# Patient Record
Sex: Male | Born: 1935 | Race: Black or African American | Hispanic: No | Marital: Married
Health system: Southern US, Community
[De-identification: ages and names within clinical notes are randomized; demographics above are authoritative.]

## PROBLEM LIST (undated history)

## (undated) DIAGNOSIS — J849 Interstitial pulmonary disease, unspecified: Secondary | ICD-10-CM

## (undated) DIAGNOSIS — D649 Anemia, unspecified: Secondary | ICD-10-CM

## (undated) DIAGNOSIS — E0591 Thyrotoxicosis, unspecified with thyrotoxic crisis or storm: Secondary | ICD-10-CM

## (undated) DIAGNOSIS — J449 Chronic obstructive pulmonary disease, unspecified: Secondary | ICD-10-CM

## (undated) DIAGNOSIS — I499 Cardiac arrhythmia, unspecified: Secondary | ICD-10-CM

## (undated) DIAGNOSIS — K219 Gastro-esophageal reflux disease without esophagitis: Secondary | ICD-10-CM

## (undated) DIAGNOSIS — J189 Pneumonia, unspecified organism: Secondary | ICD-10-CM

## (undated) DIAGNOSIS — I639 Cerebral infarction, unspecified: Secondary | ICD-10-CM

---

## 2016-12-26 ENCOUNTER — Other Ambulatory Visit (HOSPITAL_COMMUNITY): Payer: Self-pay

## 2016-12-26 ENCOUNTER — Ambulatory Visit (HOSPITAL_COMMUNITY)
Admission: AD | Admit: 2016-12-26 | Discharge: 2016-12-26 | Disposition: A | Discharge status: Home / Self Care | Payer: Self-pay | Source: Other Acute Inpatient Hospital | Attending: Internal Medicine | Admitting: Internal Medicine

## 2016-12-26 ENCOUNTER — Inpatient Hospital Stay
Admission: AD | Admit: 2016-12-26 | Discharge: 2017-01-22 | Disposition: A | Discharge status: Other Acute Inpatient Hospital | Payer: Self-pay | Source: Ambulatory Visit | Attending: Internal Medicine | Admitting: Internal Medicine

## 2016-12-26 DIAGNOSIS — Z9289 Personal history of other medical treatment: Secondary | ICD-10-CM

## 2016-12-26 DIAGNOSIS — J189 Pneumonia, unspecified organism: Secondary | ICD-10-CM

## 2016-12-26 DIAGNOSIS — J9690 Respiratory failure, unspecified, unspecified whether with hypoxia or hypercapnia: Secondary | ICD-10-CM | POA: Insufficient documentation

## 2016-12-26 DIAGNOSIS — R0603 Acute respiratory distress: Secondary | ICD-10-CM

## 2016-12-26 DIAGNOSIS — J9600 Acute respiratory failure, unspecified whether with hypoxia or hypercapnia: Secondary | ICD-10-CM

## 2016-12-26 DIAGNOSIS — Z931 Gastrostomy status: Secondary | ICD-10-CM

## 2016-12-26 DIAGNOSIS — Z9911 Dependence on respirator [ventilator] status: Secondary | ICD-10-CM

## 2016-12-26 HISTORY — DX: Pneumonia, unspecified organism: J18.9

## 2016-12-26 HISTORY — DX: Chronic obstructive pulmonary disease, unspecified: J44.9

## 2016-12-26 HISTORY — DX: Gastro-esophageal reflux disease without esophagitis: K21.9

## 2016-12-26 HISTORY — DX: Interstitial pulmonary disease, unspecified: J84.9

## 2016-12-26 HISTORY — DX: Anemia, unspecified: D64.9

## 2016-12-26 HISTORY — DX: Cerebral infarction, unspecified: I63.9

## 2016-12-26 HISTORY — DX: Thyrotoxicosis, unspecified with thyrotoxic crisis or storm: E05.91

## 2016-12-26 HISTORY — DX: Cardiac arrhythmia, unspecified: I49.9

## 2016-12-26 LAB — CBC
HCT: 29.5 % — ABNORMAL LOW (ref 39.0–52.0)
HEMATOCRIT: 28 % — AB (ref 39.0–52.0)
HEMOGLOBIN: 9.2 g/dL — AB (ref 13.0–17.0)
Hemoglobin: 9.4 g/dL — ABNORMAL LOW (ref 13.0–17.0)
MCH: 30.9 pg (ref 26.0–34.0)
MCH: 28.9 pg (ref 26.0–34.0)
MCHC: 32.9 g/dL (ref 30.0–36.0)
MCHC: 31.9 g/dL (ref 30.0–36.0)
MCV: 94 fL (ref 78.0–100.0)
MCV: 90.8 fL (ref 78.0–100.0)
Platelets: 253 10*3/uL (ref 150–400)
Platelets: 303 10*3/uL (ref 150–400)
RBC: 2.98 MIL/uL — AB (ref 4.22–5.81)
RBC: 3.25 MIL/uL — ABNORMAL LOW (ref 4.22–5.81)
RDW: 17.5 % — ABNORMAL HIGH (ref 11.5–15.5)
RDW: 17.6 % — ABNORMAL HIGH (ref 11.5–15.5)
WBC: 3.6 10*3/uL — ABNORMAL LOW (ref 4.0–10.5)
WBC: 4.6 10*3/uL (ref 4.0–10.5)

## 2016-12-26 LAB — COMPREHENSIVE METABOLIC PANEL
ALT: 23 U/L (ref 17–63)
AST: 35 U/L (ref 15–41)
Albumin: 2.5 g/dL — ABNORMAL LOW (ref 3.5–5.0)
Alkaline Phosphatase: 103 U/L (ref 38–126)
Anion gap: 8 (ref 5–15)
BUN: 45 mg/dL — ABNORMAL HIGH (ref 6–20)
CO2: 30 mmol/L (ref 22–32)
Calcium: 8.5 mg/dL — ABNORMAL LOW (ref 8.9–10.3)
Chloride: 104 mmol/L (ref 101–111)
Creatinine, Ser: 1.03 mg/dL (ref 0.61–1.24)
GFR calc Af Amer: >60 mL/min (ref >60)
GFR calc non Af Amer: >60 mL/min (ref >60)
Glucose, Bld: 83 mg/dL (ref 65–99)
Potassium: 4.4 mmol/L (ref 3.5–5.1)
Sodium: 142 mmol/L (ref 135–145)
Total Bilirubin: 1.5 mg/dL — ABNORMAL HIGH (ref 0.3–1.2)
Total Protein: 5.3 g/dL — ABNORMAL LOW (ref 6.5–8.1)

## 2016-12-26 LAB — BLOOD GAS, ARTERIAL
Acid-Base Excess: 5.1 mmol/L — ABNORMAL HIGH (ref 0.0–2.0)
Acid-base deficit: 11.2 mmol/L — ABNORMAL HIGH (ref 0.0–2.0)
Bicarbonate: 12.4 mmol/L — ABNORMAL LOW (ref 20.0–28.0)
Bicarbonate: 29.6 mmol/L — ABNORMAL HIGH (ref 20.0–28.0)
DRAWN BY: 290171
Drawn by: 31101
FIO2: 28
FIO2: 0.28
LHR: 18 {breaths}/min
MECHVT: 400 mL
O2 SAT: 98.6 %
O2 Saturation: 98.1 %
PCO2 ART: 19.1 mmHg — AB (ref 32.0–48.0)
PEEP: 5 cmH2O
PEEP: 5 cmH2O
PO2 ART: 113 mmHg — AB (ref 83.0–108.0)
Patient temperature: 98.6
Patient temperature: 98.2
RATE: 12 resp/min
VT: 450 mL
pCO2 arterial: 47.4 mmHg (ref 32.0–48.0)
pH, Arterial: 7.428 (ref 7.350–7.450)
pH, Arterial: 7.411 (ref 7.350–7.450)
pO2, Arterial: 100 mmHg (ref 83.0–108.0)

## 2016-12-26 LAB — PROTIME-INR
INR: 2.58
PROTHROMBIN TIME: 28.2 s — AB (ref 11.4–15.2)

## 2016-12-26 MED ORDER — IOPAMIDOL (ISOVUE-300) INJECTION 61%
50.0000 mL | Freq: Once | INTRAVENOUS | Status: AC | PRN
  Administered 2016-12-26: 50 mL

## 2016-12-27 LAB — C DIFFICILE QUICK SCREEN W PCR REFLEX
C Diff antigen: NEGATIVE
C Diff interpretation: NOT DETECTED
C Diff toxin: NEGATIVE

## 2016-12-29 LAB — URINALYSIS, ROUTINE W REFLEX MICROSCOPIC
BILIRUBIN URINE: NEGATIVE
Glucose, UA: NEGATIVE mg/dL
KETONES UR: NEGATIVE mg/dL
Nitrite: NEGATIVE
PROTEIN: 30 mg/dL — AB
SPECIFIC GRAVITY, URINE: 1.013 (ref 1.005–1.030)
pH: 5 (ref 5.0–8.0)

## 2016-12-30 ENCOUNTER — Other Ambulatory Visit (HOSPITAL_COMMUNITY): Payer: Self-pay

## 2016-12-30 LAB — BASIC METABOLIC PANEL
Anion gap: 8 (ref 5–15)
BUN: 29 mg/dL — AB (ref 6–20)
CO2: 32 mmol/L (ref 22–32)
CREATININE: 0.63 mg/dL (ref 0.61–1.24)
Calcium: 7.6 mg/dL — ABNORMAL LOW (ref 8.9–10.3)
Chloride: 102 mmol/L (ref 101–111)
GFR calc Af Amer: >60 mL/min (ref >60)
GLUCOSE: 113 mg/dL — AB (ref 65–99)
POTASSIUM: 3.8 mmol/L (ref 3.5–5.1)
SODIUM: 142 mmol/L (ref 135–145)

## 2016-12-30 LAB — URINE CULTURE

## 2016-12-30 LAB — CBC
HCT: 29.9 % — ABNORMAL LOW (ref 39.0–52.0)
Hemoglobin: 8.9 g/dL — ABNORMAL LOW (ref 13.0–17.0)
MCH: 27.7 pg (ref 26.0–34.0)
MCHC: 29.8 g/dL — AB (ref 30.0–36.0)
MCV: 93.1 fL (ref 78.0–100.0)
Platelets: 241 10*3/uL (ref 150–400)
RBC: 3.21 MIL/uL — AB (ref 4.22–5.81)
RDW: 17.6 % — AB (ref 11.5–15.5)
WBC: 6.4 10*3/uL (ref 4.0–10.5)

## 2016-12-30 LAB — BLOOD GAS, ARTERIAL
Acid-Base Excess: 11.5 mmol/L — ABNORMAL HIGH (ref 0.0–2.0)
BICARBONATE: 36.5 mmol/L — AB (ref 20.0–28.0)
FIO2: 28
O2 SAT: 98.6 %
PATIENT TEMPERATURE: 97.4
PCO2 ART: 56.3 mmHg — AB (ref 32.0–48.0)
PEEP/CPAP: 5 cmH2O
PO2 ART: 126 mmHg — AB (ref 83.0–108.0)
PRESSURE SUPPORT: 12 cmH2O
pH, Arterial: 7.424 (ref 7.350–7.450)

## 2016-12-30 LAB — DIGOXIN LEVEL: DIGOXIN LVL: 0.9 ng/mL (ref 0.8–2.0)

## 2016-12-30 LAB — PHOSPHORUS: Phosphorus: 2.5 mg/dL (ref 2.5–4.6)

## 2016-12-30 LAB — TSH: TSH: 0.053 u[IU]/mL — AB (ref 0.350–4.500)

## 2016-12-30 LAB — VANCOMYCIN, TROUGH: VANCOMYCIN TR: 16 ug/mL (ref 15–20)

## 2016-12-30 LAB — MAGNESIUM: Magnesium: 1.7 mg/dL (ref 1.7–2.4)

## 2016-12-30 LAB — T4, FREE: Free T4: 0.86 ng/dL (ref 0.61–1.12)

## 2016-12-31 ENCOUNTER — Other Ambulatory Visit (HOSPITAL_COMMUNITY): Payer: Self-pay

## 2016-12-31 LAB — BLOOD GAS, ARTERIAL
Acid-Base Excess: 11.1 mmol/L — ABNORMAL HIGH (ref 0.0–2.0)
BICARBONATE: 35.8 mmol/L — AB (ref 20.0–28.0)
O2 CONTENT: 5 L/min
O2 SAT: 95.9 %
PATIENT TEMPERATURE: 98.6
PCO2 ART: 53.9 mmHg — AB (ref 32.0–48.0)
PO2 ART: 77.7 mmHg — AB (ref 83.0–108.0)
pH, Arterial: 7.438 (ref 7.350–7.450)

## 2016-12-31 LAB — BLOOD CULTURE ID PANEL (REFLEXED)
Acinetobacter baumannii: NOT DETECTED
CANDIDA GLABRATA: NOT DETECTED
CANDIDA KRUSEI: NOT DETECTED
Candida albicans: DETECTED — AB
Candida parapsilosis: NOT DETECTED
Candida tropicalis: NOT DETECTED
ENTEROBACTER CLOACAE COMPLEX: NOT DETECTED
ENTEROBACTERIACEAE SPECIES: NOT DETECTED
ENTEROCOCCUS SPECIES: NOT DETECTED
ESCHERICHIA COLI: NOT DETECTED
Haemophilus influenzae: NOT DETECTED
Klebsiella oxytoca: NOT DETECTED
Klebsiella pneumoniae: NOT DETECTED
Listeria monocytogenes: NOT DETECTED
NEISSERIA MENINGITIDIS: NOT DETECTED
PSEUDOMONAS AERUGINOSA: NOT DETECTED
Proteus species: NOT DETECTED
STAPHYLOCOCCUS SPECIES: NOT DETECTED
STREPTOCOCCUS AGALACTIAE: NOT DETECTED
STREPTOCOCCUS PNEUMONIAE: NOT DETECTED
STREPTOCOCCUS PYOGENES: NOT DETECTED
Serratia marcescens: NOT DETECTED
Staphylococcus aureus (BCID): NOT DETECTED
Streptococcus species: NOT DETECTED

## 2016-12-31 LAB — BASIC METABOLIC PANEL
Anion gap: 6 (ref 5–15)
BUN: 25 mg/dL — ABNORMAL HIGH (ref 6–20)
CALCIUM: 7.4 mg/dL — AB (ref 8.9–10.3)
CHLORIDE: 106 mmol/L (ref 101–111)
CO2: 31 mmol/L (ref 22–32)
CREATININE: 0.63 mg/dL (ref 0.61–1.24)
GFR calc Af Amer: >60 mL/min (ref >60)
Glucose, Bld: 119 mg/dL — ABNORMAL HIGH (ref 65–99)
Potassium: 3.8 mmol/L (ref 3.5–5.1)
Sodium: 143 mmol/L (ref 135–145)

## 2016-12-31 LAB — CBC
HCT: 28.7 % — ABNORMAL LOW (ref 39.0–52.0)
Hemoglobin: 8.6 g/dL — ABNORMAL LOW (ref 13.0–17.0)
MCH: 28.4 pg (ref 26.0–34.0)
MCHC: 30 g/dL (ref 30.0–36.0)
MCV: 94.7 fL (ref 78.0–100.0)
Platelets: 282 10*3/uL (ref 150–400)
RBC: 3.03 MIL/uL — AB (ref 4.22–5.81)
RDW: 17.5 % — ABNORMAL HIGH (ref 11.5–15.5)
WBC: 8.3 10*3/uL (ref 4.0–10.5)

## 2016-12-31 LAB — CULTURE, RESPIRATORY W GRAM STAIN

## 2016-12-31 LAB — CK: CK TOTAL: 38 U/L — AB (ref 49–397)

## 2016-12-31 LAB — CULTURE, RESPIRATORY

## 2016-12-31 LAB — TROPONIN I: TROPONIN I: 0.08 ng/mL — AB (ref <0.03)

## 2017-01-01 LAB — BASIC METABOLIC PANEL
Anion gap: 3 — ABNORMAL LOW (ref 5–15)
BUN: 28 mg/dL — ABNORMAL HIGH (ref 6–20)
CALCIUM: 7.8 mg/dL — AB (ref 8.9–10.3)
CO2: 39 mmol/L — ABNORMAL HIGH (ref 22–32)
Chloride: 102 mmol/L (ref 101–111)
Creatinine, Ser: 0.6 mg/dL — ABNORMAL LOW (ref 0.61–1.24)
Glucose, Bld: 111 mg/dL — ABNORMAL HIGH (ref 65–99)
POTASSIUM: 3.4 mmol/L — AB (ref 3.5–5.1)
SODIUM: 144 mmol/L (ref 135–145)

## 2017-01-01 LAB — CBC WITH DIFFERENTIAL/PLATELET
BASOS ABS: 0 10*3/uL (ref 0.0–0.1)
BASOS PCT: 0 %
EOS ABS: 0.1 10*3/uL (ref 0.0–0.7)
EOS PCT: 1 %
HCT: 28.5 % — ABNORMAL LOW (ref 39.0–52.0)
Hemoglobin: 8.4 g/dL — ABNORMAL LOW (ref 13.0–17.0)
LYMPHS PCT: 21 %
Lymphs Abs: 1.4 10*3/uL (ref 0.7–4.0)
MCH: 27.7 pg (ref 26.0–34.0)
MCHC: 29.5 g/dL — ABNORMAL LOW (ref 30.0–36.0)
MCV: 94.1 fL (ref 78.0–100.0)
MONO ABS: 0.4 10*3/uL (ref 0.1–1.0)
Monocytes Relative: 6 %
Neutro Abs: 4.9 10*3/uL (ref 1.7–7.7)
Neutrophils Relative %: 72 %
Platelets: 277 10*3/uL (ref 150–400)
RBC: 3.03 MIL/uL — AB (ref 4.22–5.81)
RDW: 17.4 % — AB (ref 11.5–15.5)
WBC: 6.9 10*3/uL (ref 4.0–10.5)

## 2017-01-01 LAB — TROPONIN I: Troponin I: 0.06 ng/mL (ref <0.03)

## 2017-01-01 LAB — PHOSPHORUS: PHOSPHORUS: 2.7 mg/dL (ref 2.5–4.6)

## 2017-01-01 LAB — CK TOTAL AND CKMB (NOT AT ARMC)
CK, MB: 6.5 ng/mL — ABNORMAL HIGH (ref 0.5–5.0)
RELATIVE INDEX: INVALID (ref 0.0–2.5)
Total CK: 38 U/L — ABNORMAL LOW (ref 49–397)

## 2017-01-01 LAB — MAGNESIUM: MAGNESIUM: 1.8 mg/dL (ref 1.7–2.4)

## 2017-01-02 LAB — CULTURE, BLOOD (ROUTINE X 2): SPECIAL REQUESTS: ADEQUATE

## 2017-01-02 LAB — POTASSIUM: Potassium: 3.6 mmol/L (ref 3.5–5.1)

## 2017-01-03 LAB — CULTURE, BLOOD (ROUTINE X 2): CULTURE: NO GROWTH

## 2017-01-03 LAB — POTASSIUM: POTASSIUM: 3.2 mmol/L — AB (ref 3.5–5.1)

## 2017-01-04 LAB — BASIC METABOLIC PANEL
Anion gap: 6 (ref 5–15)
BUN: 34 mg/dL — AB (ref 6–20)
CO2: 41 mmol/L — ABNORMAL HIGH (ref 22–32)
CREATININE: 0.68 mg/dL (ref 0.61–1.24)
Calcium: 8.2 mg/dL — ABNORMAL LOW (ref 8.9–10.3)
Chloride: 101 mmol/L (ref 101–111)
Glucose, Bld: 124 mg/dL — ABNORMAL HIGH (ref 65–99)
POTASSIUM: 3.2 mmol/L — AB (ref 3.5–5.1)
SODIUM: 148 mmol/L — AB (ref 135–145)

## 2017-01-04 LAB — CBC
HCT: 27.7 % — ABNORMAL LOW (ref 39.0–52.0)
Hemoglobin: 8 g/dL — ABNORMAL LOW (ref 13.0–17.0)
MCH: 28 pg (ref 26.0–34.0)
MCHC: 28.9 g/dL — ABNORMAL LOW (ref 30.0–36.0)
MCV: 96.9 fL (ref 78.0–100.0)
PLATELETS: 331 10*3/uL (ref 150–400)
RBC: 2.86 MIL/uL — AB (ref 4.22–5.81)
RDW: 18.3 % — AB (ref 11.5–15.5)
WBC: 5.7 10*3/uL (ref 4.0–10.5)

## 2017-01-05 LAB — CBC
HEMATOCRIT: 26.2 % — AB (ref 39.0–52.0)
Hemoglobin: 7.7 g/dL — ABNORMAL LOW (ref 13.0–17.0)
MCH: 28.5 pg (ref 26.0–34.0)
MCHC: 29.4 g/dL — ABNORMAL LOW (ref 30.0–36.0)
MCV: 97 fL (ref 78.0–100.0)
Platelets: 356 10*3/uL (ref 150–400)
RBC: 2.7 MIL/uL — AB (ref 4.22–5.81)
RDW: 18.4 % — AB (ref 11.5–15.5)
WBC: 5.8 10*3/uL (ref 4.0–10.5)

## 2017-01-05 LAB — BASIC METABOLIC PANEL
ANION GAP: 3 — AB (ref 5–15)
BUN: 36 mg/dL — ABNORMAL HIGH (ref 6–20)
CO2: 44 mmol/L — AB (ref 22–32)
Calcium: 8.2 mg/dL — ABNORMAL LOW (ref 8.9–10.3)
Chloride: 102 mmol/L (ref 101–111)
Creatinine, Ser: 0.7 mg/dL (ref 0.61–1.24)
GFR calc Af Amer: >60 mL/min (ref >60)
GFR calc non Af Amer: >60 mL/min (ref >60)
GLUCOSE: 108 mg/dL — AB (ref 65–99)
POTASSIUM: 3.7 mmol/L (ref 3.5–5.1)
Sodium: 149 mmol/L — ABNORMAL HIGH (ref 135–145)

## 2017-01-05 LAB — MAGNESIUM: Magnesium: 2.1 mg/dL (ref 1.7–2.4)

## 2017-01-05 LAB — PHOSPHORUS: Phosphorus: 2.9 mg/dL (ref 2.5–4.6)

## 2017-01-06 LAB — BLOOD GAS, ARTERIAL
ACID-BASE EXCESS: 15.4 mmol/L — AB (ref 0.0–2.0)
BICARBONATE: 40.8 mmol/L — AB (ref 20.0–28.0)
O2 CONTENT: 8 L/min
O2 SAT: 97.5 %
PCO2 ART: 64.2 mmHg — AB (ref 32.0–48.0)
PH ART: 7.42 (ref 7.350–7.450)
Patient temperature: 98.6
pO2, Arterial: 98 mmHg (ref 83.0–108.0)

## 2017-01-06 LAB — C DIFFICILE QUICK SCREEN W PCR REFLEX
C DIFFICLE (CDIFF) ANTIGEN: NEGATIVE
C Diff interpretation: NOT DETECTED
C Diff toxin: NEGATIVE

## 2017-01-07 LAB — T4, FREE: Free T4: 0.89 ng/dL (ref 0.61–1.12)

## 2017-01-07 LAB — TSH: TSH: 1.196 u[IU]/mL (ref 0.350–4.500)

## 2017-01-08 LAB — BASIC METABOLIC PANEL
ANION GAP: 6 (ref 5–15)
BUN: 42 mg/dL — AB (ref 6–20)
CALCIUM: 8.8 mg/dL — AB (ref 8.9–10.3)
CO2: 42 mmol/L — AB (ref 22–32)
Chloride: 108 mmol/L (ref 101–111)
Creatinine, Ser: 0.87 mg/dL (ref 0.61–1.24)
GFR calc Af Amer: >60 mL/min (ref >60)
GLUCOSE: 114 mg/dL — AB (ref 65–99)
Potassium: 4.9 mmol/L (ref 3.5–5.1)
Sodium: 156 mmol/L — ABNORMAL HIGH (ref 135–145)

## 2017-01-08 LAB — CBC
HEMATOCRIT: 28.9 % — AB (ref 39.0–52.0)
Hemoglobin: 8.4 g/dL — ABNORMAL LOW (ref 13.0–17.0)
MCH: 29.2 pg (ref 26.0–34.0)
MCHC: 29.1 g/dL — AB (ref 30.0–36.0)
MCV: 100.3 fL — AB (ref 78.0–100.0)
PLATELETS: 383 10*3/uL (ref 150–400)
RBC: 2.88 MIL/uL — ABNORMAL LOW (ref 4.22–5.81)
RDW: 18.8 % — AB (ref 11.5–15.5)
WBC: 5.8 10*3/uL (ref 4.0–10.5)

## 2017-01-08 LAB — T3, FREE: T3, Free: 2.2 pg/mL (ref 2.0–4.4)

## 2017-01-09 LAB — BASIC METABOLIC PANEL
Anion gap: 4 — ABNORMAL LOW (ref 5–15)
BUN: 45 mg/dL — AB (ref 6–20)
CHLORIDE: 105 mmol/L (ref 101–111)
CO2: 41 mmol/L — AB (ref 22–32)
CREATININE: 0.98 mg/dL (ref 0.61–1.24)
Calcium: 8.6 mg/dL — ABNORMAL LOW (ref 8.9–10.3)
GFR calc Af Amer: >60 mL/min (ref >60)
GFR calc non Af Amer: >60 mL/min (ref >60)
GLUCOSE: 103 mg/dL — AB (ref 65–99)
POTASSIUM: 4.9 mmol/L (ref 3.5–5.1)
Sodium: 150 mmol/L — ABNORMAL HIGH (ref 135–145)

## 2017-01-12 LAB — BASIC METABOLIC PANEL
ANION GAP: 7 (ref 5–15)
BUN: 51 mg/dL — ABNORMAL HIGH (ref 6–20)
CALCIUM: 8.5 mg/dL — AB (ref 8.9–10.3)
CHLORIDE: 104 mmol/L (ref 101–111)
CO2: 37 mmol/L — AB (ref 22–32)
CREATININE: 1.1 mg/dL (ref 0.61–1.24)
GFR calc Af Amer: >60 mL/min (ref >60)
GFR calc non Af Amer: >60 mL/min (ref >60)
GLUCOSE: 116 mg/dL — AB (ref 65–99)
Potassium: 5.1 mmol/L (ref 3.5–5.1)
Sodium: 148 mmol/L — ABNORMAL HIGH (ref 135–145)

## 2017-01-13 LAB — RENAL FUNCTION PANEL
ALBUMIN: 2.3 g/dL — AB (ref 3.5–5.0)
ANION GAP: 8 (ref 5–15)
BUN: 47 mg/dL — ABNORMAL HIGH (ref 6–20)
CALCIUM: 8.6 mg/dL — AB (ref 8.9–10.3)
CO2: 35 mmol/L — AB (ref 22–32)
Chloride: 105 mmol/L (ref 101–111)
Creatinine, Ser: 1.07 mg/dL (ref 0.61–1.24)
GFR calc Af Amer: >60 mL/min (ref >60)
GFR calc non Af Amer: >60 mL/min (ref >60)
GLUCOSE: 114 mg/dL — AB (ref 65–99)
PHOSPHORUS: 3.8 mg/dL (ref 2.5–4.6)
POTASSIUM: 5.1 mmol/L (ref 3.5–5.1)
SODIUM: 148 mmol/L — AB (ref 135–145)

## 2017-01-13 LAB — CBC
HEMATOCRIT: 24 % — AB (ref 39.0–52.0)
Hemoglobin: 6.9 g/dL — CL (ref 13.0–17.0)
MCH: 28.8 pg (ref 26.0–34.0)
MCHC: 28.8 g/dL — ABNORMAL LOW (ref 30.0–36.0)
MCV: 100 fL (ref 78.0–100.0)
PLATELETS: 231 10*3/uL (ref 150–400)
RBC: 2.4 MIL/uL — AB (ref 4.22–5.81)
RDW: 20 % — ABNORMAL HIGH (ref 11.5–15.5)
WBC: 6.7 10*3/uL (ref 4.0–10.5)

## 2017-01-13 LAB — MAGNESIUM: MAGNESIUM: 2.7 mg/dL — AB (ref 1.7–2.4)

## 2017-01-13 LAB — T4, FREE: Free T4: 0.87 ng/dL (ref 0.61–1.12)

## 2017-01-13 LAB — TSH: TSH: 1.759 u[IU]/mL (ref 0.350–4.500)

## 2017-01-14 LAB — CBC
HCT: 26.7 % — ABNORMAL LOW (ref 39.0–52.0)
HEMOGLOBIN: 7.8 g/dL — AB (ref 13.0–17.0)
MCH: 28.9 pg (ref 26.0–34.0)
MCHC: 29.2 g/dL — ABNORMAL LOW (ref 30.0–36.0)
MCV: 98.9 fL (ref 78.0–100.0)
PLATELETS: 232 10*3/uL (ref 150–400)
RBC: 2.7 MIL/uL — AB (ref 4.22–5.81)
RDW: 20.6 % — ABNORMAL HIGH (ref 11.5–15.5)
WBC: 5.5 10*3/uL (ref 4.0–10.5)

## 2017-01-14 LAB — PREPARE RBC (CROSSMATCH)

## 2017-01-14 LAB — OCCULT BLOOD X 1 CARD TO LAB, STOOL: FECAL OCCULT BLD: POSITIVE — AB

## 2017-01-15 LAB — BASIC METABOLIC PANEL
Anion gap: 9 (ref 5–15)
BUN: 40 mg/dL — AB (ref 6–20)
CHLORIDE: 107 mmol/L (ref 101–111)
CO2: 30 mmol/L (ref 22–32)
CREATININE: 0.95 mg/dL (ref 0.61–1.24)
Calcium: 8.4 mg/dL — ABNORMAL LOW (ref 8.9–10.3)
GFR calc Af Amer: >60 mL/min (ref >60)
GFR calc non Af Amer: >60 mL/min (ref >60)
Glucose, Bld: 96 mg/dL (ref 65–99)
POTASSIUM: 5 mmol/L (ref 3.5–5.1)
SODIUM: 146 mmol/L — AB (ref 135–145)

## 2017-01-15 LAB — CBC
HCT: 27.5 % — ABNORMAL LOW (ref 39.0–52.0)
HEMOGLOBIN: 8.1 g/dL — AB (ref 13.0–17.0)
MCH: 28.7 pg (ref 26.0–34.0)
MCHC: 29.5 g/dL — AB (ref 30.0–36.0)
MCV: 97.5 fL (ref 78.0–100.0)
Platelets: 206 10*3/uL (ref 150–400)
RBC: 2.82 MIL/uL — AB (ref 4.22–5.81)
RDW: 20.8 % — ABNORMAL HIGH (ref 11.5–15.5)
WBC: 7.1 10*3/uL (ref 4.0–10.5)

## 2017-01-16 LAB — BPAM RBC
BLOOD PRODUCT EXPIRATION DATE: 201807222359
Blood Product Expiration Date: 201807222359
ISSUE DATE / TIME: 201807031401
ISSUE DATE / TIME: 201807110112
UNIT TYPE AND RH: 6200
Unit Type and Rh: 6200

## 2017-01-16 LAB — TYPE AND SCREEN
ABO/RH(D): A POS
Antibody Screen: POSITIVE
DAT, IgG: NEGATIVE
DONOR AG TYPE: NEGATIVE
DONOR AG TYPE: NEGATIVE
UNIT DIVISION: 0
Unit division: 0

## 2017-01-18 LAB — BASIC METABOLIC PANEL
ANION GAP: 3 — AB (ref 5–15)
BUN: 32 mg/dL — ABNORMAL HIGH (ref 6–20)
CO2: 34 mmol/L — ABNORMAL HIGH (ref 22–32)
Calcium: 8.7 mg/dL — ABNORMAL LOW (ref 8.9–10.3)
Chloride: 108 mmol/L (ref 101–111)
Creatinine, Ser: 0.82 mg/dL (ref 0.61–1.24)
GLUCOSE: 122 mg/dL — AB (ref 65–99)
POTASSIUM: 4.7 mmol/L (ref 3.5–5.1)
SODIUM: 145 mmol/L (ref 135–145)

## 2017-01-18 LAB — CBC
HCT: 29.4 % — ABNORMAL LOW (ref 39.0–52.0)
Hemoglobin: 8.3 g/dL — ABNORMAL LOW (ref 13.0–17.0)
MCH: 28.4 pg (ref 26.0–34.0)
MCHC: 28.2 g/dL — ABNORMAL LOW (ref 30.0–36.0)
MCV: 100.7 fL — AB (ref 78.0–100.0)
PLATELETS: 254 10*3/uL (ref 150–400)
RBC: 2.92 MIL/uL — AB (ref 4.22–5.81)
RDW: 20.3 % — ABNORMAL HIGH (ref 11.5–15.5)
WBC: 7.4 10*3/uL (ref 4.0–10.5)

## 2017-01-19 ENCOUNTER — Other Ambulatory Visit (HOSPITAL_COMMUNITY): Payer: Self-pay

## 2017-01-19 LAB — BASIC METABOLIC PANEL
ANION GAP: 6 (ref 5–15)
BUN: 31 mg/dL — ABNORMAL HIGH (ref 6–20)
CALCIUM: 8.3 mg/dL — AB (ref 8.9–10.3)
CHLORIDE: 107 mmol/L (ref 101–111)
CO2: 31 mmol/L (ref 22–32)
Creatinine, Ser: 0.76 mg/dL (ref 0.61–1.24)
GFR calc Af Amer: >60 mL/min (ref >60)
GFR calc non Af Amer: >60 mL/min (ref >60)
GLUCOSE: 91 mg/dL (ref 65–99)
POTASSIUM: 4.4 mmol/L (ref 3.5–5.1)
Sodium: 144 mmol/L (ref 135–145)

## 2017-01-19 LAB — MAGNESIUM: Magnesium: 2.1 mg/dL (ref 1.7–2.4)

## 2017-01-19 LAB — CBC
HEMATOCRIT: 24.5 % — AB (ref 39.0–52.0)
HEMOGLOBIN: 7.1 g/dL — AB (ref 13.0–17.0)
MCH: 29 pg (ref 26.0–34.0)
MCHC: 29 g/dL — ABNORMAL LOW (ref 30.0–36.0)
MCV: 100 fL (ref 78.0–100.0)
Platelets: 187 10*3/uL (ref 150–400)
RBC: 2.45 MIL/uL — ABNORMAL LOW (ref 4.22–5.81)
RDW: 20 % — AB (ref 11.5–15.5)
WBC: 7.1 10*3/uL (ref 4.0–10.5)

## 2017-01-19 LAB — T4, FREE: FREE T4: 0.94 ng/dL (ref 0.61–1.12)

## 2017-01-19 LAB — TSH: TSH: 1.232 u[IU]/mL (ref 0.350–4.500)

## 2017-01-21 LAB — BASIC METABOLIC PANEL
Anion gap: 3 — ABNORMAL LOW (ref 5–15)
BUN: 37 mg/dL — AB (ref 6–20)
CHLORIDE: 107 mmol/L (ref 101–111)
CO2: 34 mmol/L — ABNORMAL HIGH (ref 22–32)
Calcium: 8.7 mg/dL — ABNORMAL LOW (ref 8.9–10.3)
Creatinine, Ser: 0.81 mg/dL (ref 0.61–1.24)
GFR calc Af Amer: >60 mL/min (ref >60)
Glucose, Bld: 107 mg/dL — ABNORMAL HIGH (ref 65–99)
POTASSIUM: 4.8 mmol/L (ref 3.5–5.1)
Sodium: 144 mmol/L (ref 135–145)

## 2017-01-21 LAB — CBC
HEMATOCRIT: 25.7 % — AB (ref 39.0–52.0)
Hemoglobin: 7.3 g/dL — ABNORMAL LOW (ref 13.0–17.0)
MCH: 28.7 pg (ref 26.0–34.0)
MCHC: 28.4 g/dL — AB (ref 30.0–36.0)
MCV: 101.2 fL — AB (ref 78.0–100.0)
Platelets: 197 10*3/uL (ref 150–400)
RBC: 2.54 MIL/uL — ABNORMAL LOW (ref 4.22–5.81)
RDW: 19.9 % — AB (ref 11.5–15.5)
WBC: 5.7 10*3/uL (ref 4.0–10.5)

## 2017-01-22 ENCOUNTER — Inpatient Hospital Stay
Admission: AD | Admit: 2017-01-22 | Discharge: 2017-03-19 | Disposition: A | Discharge status: Hospice | Payer: Self-pay | Source: Ambulatory Visit | Attending: Internal Medicine | Admitting: Internal Medicine

## 2017-01-22 ENCOUNTER — Encounter
Admission: AD | Disposition: A | Discharge status: Other Acute Inpatient Hospital | Payer: Self-pay | Source: Ambulatory Visit | Attending: Internal Medicine

## 2017-01-22 ENCOUNTER — Encounter: Payer: Self-pay | Admitting: Surgery

## 2017-01-22 ENCOUNTER — Encounter (HOSPITAL_COMMUNITY): Payer: Self-pay | Admitting: Certified Registered"

## 2017-01-22 ENCOUNTER — Encounter (HOSPITAL_COMMUNITY)
Admission: RE | Disposition: A | Discharge status: Nursing Home (Includes ICF) | Payer: Self-pay | Source: Ambulatory Visit | Attending: Otolaryngology

## 2017-01-22 ENCOUNTER — Ambulatory Visit (HOSPITAL_COMMUNITY)
Admission: RE | Admit: 2017-01-22 | Discharge: 2017-01-22 | Disposition: A | Discharge status: Other Acute Inpatient Hospital | Payer: Self-pay | Source: Ambulatory Visit | Attending: Otolaryngology | Admitting: Otolaryngology

## 2017-01-22 DIAGNOSIS — J9690 Respiratory failure, unspecified, unspecified whether with hypoxia or hypercapnia: Secondary | ICD-10-CM | POA: Insufficient documentation

## 2017-01-22 DIAGNOSIS — R0902 Hypoxemia: Secondary | ICD-10-CM

## 2017-01-22 DIAGNOSIS — J449 Chronic obstructive pulmonary disease, unspecified: Secondary | ICD-10-CM | POA: Insufficient documentation

## 2017-01-22 DIAGNOSIS — J811 Chronic pulmonary edema: Secondary | ICD-10-CM

## 2017-01-22 DIAGNOSIS — R0603 Acute respiratory distress: Secondary | ICD-10-CM

## 2017-01-22 DIAGNOSIS — J90 Pleural effusion, not elsewhere classified: Secondary | ICD-10-CM

## 2017-01-22 DIAGNOSIS — J849 Interstitial pulmonary disease, unspecified: Secondary | ICD-10-CM | POA: Insufficient documentation

## 2017-01-22 DIAGNOSIS — Z430 Encounter for attention to tracheostomy: Secondary | ICD-10-CM | POA: Insufficient documentation

## 2017-01-22 DIAGNOSIS — I4891 Unspecified atrial fibrillation: Secondary | ICD-10-CM | POA: Insufficient documentation

## 2017-01-22 DIAGNOSIS — Z7901 Long term (current) use of anticoagulants: Secondary | ICD-10-CM | POA: Insufficient documentation

## 2017-01-22 DIAGNOSIS — X58XXXD Exposure to other specified factors, subsequent encounter: Secondary | ICD-10-CM | POA: Insufficient documentation

## 2017-01-22 DIAGNOSIS — Z9889 Other specified postprocedural states: Secondary | ICD-10-CM

## 2017-01-22 DIAGNOSIS — R7989 Other specified abnormal findings of blood chemistry: Secondary | ICD-10-CM

## 2017-01-22 DIAGNOSIS — Z8673 Personal history of transient ischemic attack (TIA), and cerebral infarction without residual deficits: Secondary | ICD-10-CM | POA: Insufficient documentation

## 2017-01-22 DIAGNOSIS — I82C19 Acute embolism and thrombosis of unspecified internal jugular vein: Secondary | ICD-10-CM

## 2017-01-22 DIAGNOSIS — Z452 Encounter for adjustment and management of vascular access device: Secondary | ICD-10-CM

## 2017-01-22 HISTORY — PX: TRACHEOSTOMY TUBE PLACEMENT: SHX814

## 2017-01-22 SURGERY — CREATION, TRACHEOSTOMY
Anesthesia: General

## 2017-01-22 SURGERY — CREATION, TRACHEOSTOMY
Anesthesia: General | Site: Neck

## 2017-01-22 MED ORDER — ROCURONIUM BROMIDE 10 MG/ML (PF) SYRINGE
PREFILLED_SYRINGE | INTRAVENOUS | Status: DC | PRN
  Administered 2017-01-22: 25 mg via INTRAVENOUS

## 2017-01-22 MED ORDER — CEFAZOLIN SODIUM 1 G IJ SOLR
INTRAMUSCULAR | Status: AC
  Filled 2017-01-22: qty 20

## 2017-01-22 MED ORDER — 0.9 % SODIUM CHLORIDE (POUR BTL) OPTIME
TOPICAL | Status: DC | PRN
  Administered 2017-01-22: 1000 mL

## 2017-01-22 MED ORDER — LIDOCAINE-EPINEPHRINE 1 %-1:100000 IJ SOLN
INTRAMUSCULAR | Status: DC | PRN
  Administered 2017-01-22: 7 mL via INTRADERMAL

## 2017-01-22 MED ORDER — FENTANYL CITRATE (PF) 100 MCG/2ML IJ SOLN: 25.0000 ug | INTRAMUSCULAR | Status: DC | PRN

## 2017-01-22 MED ORDER — PHENYLEPHRINE 40 MCG/ML (10ML) SYRINGE FOR IV PUSH (FOR BLOOD PRESSURE SUPPORT)
PREFILLED_SYRINGE | INTRAVENOUS | Status: DC | PRN
  Administered 2017-01-22: 120 ug via INTRAVENOUS
  Administered 2017-01-22: 40 ug via INTRAVENOUS
  Administered 2017-01-22 (×3): 80 ug via INTRAVENOUS
  Administered 2017-01-22: 120 ug via INTRAVENOUS

## 2017-01-22 MED ORDER — SODIUM CHLORIDE 0.9 % IV SOLN
INTRAVENOUS | Status: DC | PRN
  Administered 2017-01-22: 11:00:00 via INTRAVENOUS

## 2017-01-22 MED ORDER — PROPOFOL 10 MG/ML IV BOLUS
INTRAVENOUS | Status: AC
  Filled 2017-01-22: qty 20

## 2017-01-22 MED ORDER — LACTATED RINGERS IV SOLN
INTRAVENOUS | Status: DC | PRN
  Administered 2017-01-22: 10:00:00 via INTRAVENOUS

## 2017-01-22 MED ORDER — LIDOCAINE-EPINEPHRINE 1 %-1:100000 IJ SOLN
INTRAMUSCULAR | Status: AC
  Filled 2017-01-22: qty 1

## 2017-01-22 MED ORDER — PHENYLEPHRINE HCL 10 MG/ML IJ SOLN
INTRAMUSCULAR | Status: DC | PRN
  Administered 2017-01-22: 20 ug/min via INTRAVENOUS

## 2017-01-22 MED ORDER — CEFAZOLIN SODIUM-DEXTROSE 2-3 GM-% IV SOLR
INTRAVENOUS | Status: DC | PRN
  Administered 2017-01-22: 2 g via INTRAVENOUS

## 2017-01-22 MED ORDER — SUGAMMADEX SODIUM 200 MG/2ML IV SOLN
INTRAVENOUS | Status: DC | PRN
  Administered 2017-01-22: 120 mg via INTRAVENOUS

## 2017-01-22 MED ORDER — PROPOFOL 10 MG/ML IV BOLUS
INTRAVENOUS | Status: DC | PRN
  Administered 2017-01-22: 50 mg via INTRAVENOUS

## 2017-01-22 MED ORDER — ONDANSETRON HCL 4 MG/2ML IJ SOLN
INTRAMUSCULAR | Status: DC | PRN
  Administered 2017-01-22: 4 mg via INTRAVENOUS

## 2017-01-22 MED ORDER — FENTANYL CITRATE (PF) 250 MCG/5ML IJ SOLN
INTRAMUSCULAR | Status: AC
Start: 2017-01-22 — End: 2017-01-22
  Filled 2017-01-22: qty 5

## 2017-01-22 MED ORDER — FENTANYL CITRATE (PF) 100 MCG/2ML IJ SOLN
INTRAMUSCULAR | Status: DC | PRN
  Administered 2017-01-22: 50 ug via INTRAVENOUS

## 2017-01-22 SURGICAL SUPPLY — 39 items
BLADE SURG 15 STRL LF DISP TIS (BLADE) ×1 IMPLANT
BLADE SURG 15 STRL SS (BLADE) ×2
CLEANER TIP ELECTROSURG 2X2 (MISCELLANEOUS) ×3 IMPLANT
COVER SURGICAL LIGHT HANDLE (MISCELLANEOUS) ×3 IMPLANT
DRAPE HALF SHEET 40X57 (DRAPES) ×3 IMPLANT
ELECT COATED BLADE 2.86 ST (ELECTRODE) ×3 IMPLANT
ELECT REM PT RETURN 9FT ADLT (ELECTROSURGICAL) ×3
ELECTRODE REM PT RTRN 9FT ADLT (ELECTROSURGICAL) ×1 IMPLANT
GAUZE SPONGE 4X4 16PLY XRAY LF (GAUZE/BANDAGES/DRESSINGS) ×3 IMPLANT
GEL ULTRASOUND 20GR AQUASONIC (MISCELLANEOUS) ×3 IMPLANT
GLOVE SS BIOGEL STRL SZ 7.5 (GLOVE) ×1 IMPLANT
GLOVE SUPERSENSE BIOGEL SZ 7.5 (GLOVE) ×2
GLOVE SURG SS PI 7.0 STRL IVOR (GLOVE) ×3 IMPLANT
GOWN STRL REUS W/ TWL LRG LVL3 (GOWN DISPOSABLE) ×1 IMPLANT
GOWN STRL REUS W/ TWL XL LVL3 (GOWN DISPOSABLE) ×1 IMPLANT
GOWN STRL REUS W/TWL LRG LVL3 (GOWN DISPOSABLE) ×2
GOWN STRL REUS W/TWL XL LVL3 (GOWN DISPOSABLE) ×2
HOLDER TRACH TUBE VELCRO 19.5 (MISCELLANEOUS) ×3 IMPLANT
KIT BASIN OR (CUSTOM PROCEDURE TRAY) ×3 IMPLANT
KIT ROOM TURNOVER OR (KITS) ×3 IMPLANT
KIT SUCTION CATH 14FR (SUCTIONS) IMPLANT
NEEDLE HYPO 25GX1X1/2 BEV (NEEDLE) ×3 IMPLANT
NS IRRIG 1000ML POUR BTL (IV SOLUTION) ×3 IMPLANT
PACK EENT II TURBAN DRAPE (CUSTOM PROCEDURE TRAY) ×3 IMPLANT
PAD ARMBOARD 7.5X6 YLW CONV (MISCELLANEOUS) ×6 IMPLANT
PENCIL BUTTON HOLSTER BLD 10FT (ELECTRODE) ×3 IMPLANT
SOL PREP POV-IOD 4OZ 10% (MISCELLANEOUS) ×3 IMPLANT
SPONGE DRAIN TRACH 4X4 STRL 2S (GAUZE/BANDAGES/DRESSINGS) ×3 IMPLANT
SPONGE INTESTINAL PEANUT (DISPOSABLE) ×3 IMPLANT
SUT SILK 2 0 SH CR/8 (SUTURE) ×3 IMPLANT
SUT SILK 3 0 TIES 10X30 (SUTURE) ×3 IMPLANT
SYR 5ML LL (SYRINGE) ×3 IMPLANT
SYR 5ML LUER SLIP (SYRINGE) ×3 IMPLANT
SYR CONTROL 10ML LL (SYRINGE) ×3 IMPLANT
TOWEL OR 17X24 6PK STRL BLUE (TOWEL DISPOSABLE) ×3 IMPLANT
TOWEL OR 17X26 10 PK STRL BLUE (TOWEL DISPOSABLE) ×3 IMPLANT
TUBE CONNECTING 12'X1/4 (SUCTIONS) ×1
TUBE CONNECTING 12X1/4 (SUCTIONS) ×2 IMPLANT
TUBE TRACH SHILEY  6 DIST  CUF (TUBING) ×3 IMPLANT

## 2017-01-22 NOTE — H&P (Signed)
PREOPERATIVE H&P Chief Complaint: aspiration and respiratory difficulty  HPI: Adrian Cobb is a 83 y.o. male who presents for evaluation of airway difficulty and aspiration. He's been intubated several times the last month because of respiratory difficulty. He has tendency for aspiration and has difficulty handling excessive mucous build up in throat. He was admitted to [facility] on 6/22. It was recommended that he have a trach placed before discharge. Patient has history of A fib and is on Eliquis. He's taken to the OR for tracheostomy.  Past Medical History:  Diagnosis Date  . Anemia   . Chronic interstitial lung disease (HCC)   . COPD (chronic obstructive pulmonary disease) (HCC)   . Dysrhythmia    a.fib  . GERD (gastroesophageal reflux disease)   . Pneumonia    aspiration  . Stroke (HCC)   . Thyroid storm    History reviewed. No pertinent surgical history. Social History   Social History  . Marital status: Married    Spouse name: N/A  . Number of children: N/A  . Years of education: N/A   Social History Main Topics  . Smoking status: None  . Smokeless tobacco: None  . Alcohol use None  . Drug use: Unknown  . Sexual activity: Not Asked   Other Topics Concern  . None   Social History Narrative  . None   History reviewed. No pertinent family history. Not on File Prior to Admission medications   Not on File     Positive ROS: neg  All other systems have been reviewed and were otherwise negative with the exception of those mentioned in the HPI and as above.  Physical Exam: There were no vitals filed for this visit.  General: Alert, no acute distress Oral: Normal oral mucosa and tonsils Nasal: Clear nasal passages Neck: No palpable adenopathy or thyroid nodules Cardiovascular: Regular rate and rhythm, no murmur.  Respiratory: Clear to auscultation Neurologic: Alert and oriented x 3   Assessment/Plan: RESPIRTORY FAILURE Plan for  Procedure(s): TRACHEOSTOMY   Dillard CannonHRISTOPHER Tarus Briski, MD 01/22/2017 9:35 AM

## 2017-01-22 NOTE — Brief Op Note (Signed)
01/22/2017  10:54 AM  PATIENT:  Milagros EvenerJames Kivi  83 y.o. male  PRE-OPERATIVE DIAGNOSIS:  aspiration   POST-OPERATIVE DIAGNOSIS:  aspiration  PROCEDURE:  Procedure(s): TRACHEOSTOMY (N/A) #6 cuffed Shiley  SURGEON:  Surgeon(s) and Role:    Drema Halon* Newman, Christopher E, MD - Primary  PHYSICIAN ASSISTANT:   ASSISTANTS: none   ANESTHESIA:   general  EBL:  No intake/output data recorded.  BLOOD ADMINISTERED:none  DRAINS: none   LOCAL MEDICATIONS USED:  XYLOCAINE with EPI 7cc  SPECIMEN:  No Specimen  DISPOSITION OF SPECIMEN:  N/A  COUNTS:  YES  TOURNIQUET:  * No tourniquets in log *  DICTATION: .Other Dictation: Dictation Number [id number]  PLAN OF CARE: Discharge to home after PACU  PATIENT DISPOSITION:  PACU - hemodynamically stable.   Delay start of Pharmacological VTE agent (>24hrs) due to surgical blood loss or risk of bleeding: yes

## 2017-01-22 NOTE — Anesthesia Procedure Notes (Signed)
Procedure Name: Intubation Date/Time: 01/22/2017 10:25 AM Performed by: Sampson Si E Pre-anesthesia Checklist: Patient identified, Emergency Drugs available, Suction available and Patient being monitored Patient Re-evaluated:Patient Re-evaluated prior to induction Oxygen Delivery Method: Circle System Utilized Preoxygenation: Pre-oxygenation with 100% oxygen Induction Type: IV induction Ventilation: Mask ventilation without difficulty Laryngoscope Size: Mac and 3 Grade View: Grade I Tube type: Oral Tube size: 7.5 mm Number of attempts: 1 Airway Equipment and Method: Stylet and Oral airway Placement Confirmation: ETT inserted through vocal cords under direct vision,  positive ETCO2 and breath sounds checked- equal and bilateral Secured at: 21 cm Tube secured with: Tape Dental Injury: Teeth and Oropharynx as per pre-operative assessment

## 2017-01-22 NOTE — Anesthesia Postprocedure Evaluation (Addendum)
Anesthesia Post Note  Patient: Adrian EvenerJames Zaro  Procedure(s) Performed: Procedure(s) (LRB): TRACHEOSTOMY (N/A)     Patient location during evaluation: Other [facility] Anesthesia Type: General Level of consciousness: awake Pain management: pain level controlled Vital Signs Assessment: post-procedure vital signs reviewed and stable Respiratory status: patient on ventilator - see flowsheet for VS (Tracheostomy) Cardiovascular status: blood pressure returned to baseline and stable Postop Assessment: no signs of nausea or vomiting Anesthetic complications: no    Last Vitals: There were no vitals filed for this visit.  Last Pain: There were no vitals filed for this visit.               [name],W. EDMOND

## 2017-01-22 NOTE — Anesthesia Preprocedure Evaluation (Addendum)
Anesthesia Evaluation  Patient identified by MRN, date of birth, ID band Patient unresponsive    Reviewed: Allergy & Precautions, H&P , NPO status , Patient's Chart, lab work & pertinent test results  Airway Mallampati: II  TM Distance: >3 FB Neck ROM: Full    Dental no notable dental hx. (+) Edentulous Upper, Edentulous Lower, Dental Advisory Given   Pulmonary pneumonia, resolved, COPD,  Chronic resp failure Aspiration pneumonia   Pulmonary exam normal breath sounds clear to auscultation       Cardiovascular + dysrhythmias Atrial Fibrillation  Rhythm:Irregular Rate:Normal     Neuro/Psych Dementia CVA, Residual Symptoms negative psych ROS   GI/Hepatic Neg liver ROS, GERD  ,  Endo/Other  Hyperthyroidism   Renal/GU negative Renal ROS  negative genitourinary   Musculoskeletal   Abdominal   Peds  Hematology  (+) anemia ,   Anesthesia Other Findings   Reproductive/Obstetrics negative OB ROS                            Anesthesia Physical Anesthesia Plan  ASA: IV  Anesthesia Plan: General   Post-op Pain Management:    Induction: Intravenous  PONV Risk Score and Plan: 3 and Ondansetron, Dexamethasone and Treatment may vary due to age or medical condition  Airway Management Planned: Tracheostomy  Additional Equipment:   Intra-op Plan:   Post-operative Plan: Post-operative intubation/ventilation  Informed Consent: I have reviewed the patients History and Physical, chart, labs and discussed the procedure including the risks, benefits and alternatives for the proposed anesthesia with the patient or authorized representative who has indicated his/her understanding and acceptance.   Dental advisory given  Plan Discussed with: CRNA  Anesthesia Plan Comments:        Anesthesia Quick Evaluation

## 2017-01-22 NOTE — Transfer of Care (Signed)
Immediate Anesthesia Transfer of Care Note  Patient: Adrian Cobb  Procedure(s) Performed: Procedure(s): TRACHEOSTOMY (N/A)  Patient Location: [facility]  Anesthesia Type:General  Level of Consciousness: lethargic and responds to stimulation  Airway & Oxygen Therapy: Patient connected to tracheostomy mask oxygen and Patient placed on Ventilator (see vital sign flow sheet for setting)  Post-op Assessment: Report given to RN and Post -op Vital signs reviewed and stable  Post vital signs: Reviewed and stable  Last Vitals: There were no vitals filed for this visit.  Last Pain: There were no vitals filed for this visit.       Complications: No apparent anesthesia complications

## 2017-01-23 ENCOUNTER — Encounter (HOSPITAL_COMMUNITY): Payer: Self-pay | Admitting: Otolaryngology

## 2017-01-23 LAB — TYPE AND SCREEN
ABO/RH(D): A POS
Antibody Screen: POSITIVE
DAT, IgG: POSITIVE
DONOR AG TYPE: NEGATIVE
Donor AG Type: NEGATIVE
UNIT DIVISION: 0
Unit division: 0

## 2017-01-23 LAB — CBC
HCT: 31.9 % — ABNORMAL LOW (ref 39.0–52.0)
HEMOGLOBIN: 9.7 g/dL — AB (ref 13.0–17.0)
MCH: 30.5 pg (ref 26.0–34.0)
MCHC: 30.4 g/dL (ref 30.0–36.0)
MCV: 100.3 fL — ABNORMAL HIGH (ref 78.0–100.0)
PLATELETS: 281 10*3/uL (ref 150–400)
RBC: 3.18 MIL/uL — AB (ref 4.22–5.81)
RDW: 18.4 % — ABNORMAL HIGH (ref 11.5–15.5)
WBC: 8.1 10*3/uL (ref 4.0–10.5)

## 2017-01-23 LAB — BPAM RBC
Blood Product Expiration Date: 201808072359
Blood Product Expiration Date: 201808072359
ISSUE DATE / TIME: 201807191017
ISSUE DATE / TIME: 201807191017
UNIT TYPE AND RH: 6200
UNIT TYPE AND RH: 6200

## 2017-01-23 LAB — BASIC METABOLIC PANEL
Anion gap: 6 (ref 5–15)
BUN: 30 mg/dL — AB (ref 6–20)
CHLORIDE: 105 mmol/L (ref 101–111)
CO2: 32 mmol/L (ref 22–32)
CREATININE: 0.84 mg/dL (ref 0.61–1.24)
Calcium: 8.5 mg/dL — ABNORMAL LOW (ref 8.9–10.3)
Glucose, Bld: 97 mg/dL (ref 65–99)
POTASSIUM: 4.7 mmol/L (ref 3.5–5.1)
SODIUM: 143 mmol/L (ref 135–145)

## 2017-01-23 NOTE — Op Note (Signed)
NAME:  Adrian Cobb, Adrian Cobb                       ACCOUNT NO.:  MEDICAL RECORD NO.:  [id number]  LOCATION:                                 FACILITY:  PHYSICIAN:  Kristine GarbeChristopher E. Ezzard StandingNewman, M.D. DATE OF BIRTH:  DATE OF PROCEDURE:  01/22/2017 DATE OF DISCHARGE:                              OPERATIVE REPORT   PREOPERATIVE DIAGNOSES:  History of aspiration with respiratory failure and history of intubations.  POSTOPERATIVE DIAGNOSES:  History of aspiration with respiratory failure and history of intubations.  OPERATION PERFORMED:  Tracheotomy with a #6 cuffed Shiley tracheostomy tube.  SURGEON:  Kristine GarbeChristopher E. Ezzard StandingNewman, M.D.  ANESTHESIA:  General endotracheal.  COMPLICATIONS:  None.  BRIEF CLINICAL NOTE:  Adrian Cobb is an [age] year old gentleman who has been admitted especially for the past 4 weeks because of respiratory problems.  He apparently has had history of CVA as well as history of AFib and is on Eliquis.  He has had chronic respiratory problems to the point where he has had trouble handling secretions, it is recommended that he undergo tracheotomy to help with handling secretions as well as his chronic respiratory problems.  He was taken to the operating room this time for tracheotomy.  DESCRIPTION OF PROCEDURE:  The patient was brought down to the operating room, underwent endotracheal anesthesia.  He had a very thin neck.  The trachea was midline with no masses around it.  Horizontal incision was made just above the suprasternal notch.  Dissection was carried down through the subcutaneous tissue.  Strap muscles were divided in midline and retracted laterally.  The thyroid isthmus covered the portion of the first as well as second tracheal ring.  Thyroid isthmus was divided and horizontal tracheotomy was made between the first and second tracheal rings.  Endotracheal tube was removed and a #6 cuffed Shiley tube was inserted without any difficulty.  The patient was ventilated well.   The trach was secured to the neck with 2-0 silk sutures x4 as well as a Velcro trach collar.  There was minimal bleeding.  DISPOSITION:  The patient is discharged to [facility] and be discharged back to [facility].          ______________________________ Kristine Garbehristopher E. Ezzard StandingNewman, M.D.    CEN/MEDQ  D:  01/22/2017  T:  01/22/2017  Job:  [id number]

## 2017-01-24 LAB — CULTURE, BLOOD (ROUTINE X 2)
CULTURE: NO GROWTH
Culture: NO GROWTH
SPECIAL REQUESTS: ADEQUATE
SPECIAL REQUESTS: ADEQUATE

## 2017-01-27 LAB — BASIC METABOLIC PANEL WITH GFR
Anion gap: 5 (ref 5–15)
BUN: 15 mg/dL (ref 6–20)
CO2: 30 mmol/L (ref 22–32)
Calcium: 8.6 mg/dL — ABNORMAL LOW (ref 8.9–10.3)
Chloride: 101 mmol/L (ref 101–111)
Creatinine, Ser: 0.68 mg/dL (ref 0.61–1.24)
GFR calc Af Amer: >60 mL/min
GFR calc non Af Amer: >60 mL/min
Glucose, Bld: 89 mg/dL (ref 65–99)
Potassium: 4.4 mmol/L (ref 3.5–5.1)
Sodium: 136 mmol/L (ref 135–145)

## 2017-01-27 LAB — CBC
HCT: 30.1 % — ABNORMAL LOW (ref 39.0–52.0)
Hemoglobin: 9.1 g/dL — ABNORMAL LOW (ref 13.0–17.0)
MCH: 27.9 pg (ref 26.0–34.0)
MCHC: 30.2 g/dL (ref 30.0–36.0)
MCV: 92.3 fL (ref 78.0–100.0)
Platelets: 229 K/uL (ref 150–400)
RBC: 3.26 MIL/uL — ABNORMAL LOW (ref 4.22–5.81)
RDW: 16.9 % — ABNORMAL HIGH (ref 11.5–15.5)
WBC: 6.7 K/uL (ref 4.0–10.5)

## 2017-01-27 LAB — PHOSPHORUS: Phosphorus: 3 mg/dL (ref 2.5–4.6)

## 2017-01-27 LAB — MAGNESIUM: Magnesium: 1.8 mg/dL (ref 1.7–2.4)

## 2017-02-02 LAB — BASIC METABOLIC PANEL
Anion gap: 9 (ref 5–15)
BUN: 13 mg/dL (ref 6–20)
CHLORIDE: 97 mmol/L — AB (ref 101–111)
CO2: 30 mmol/L (ref 22–32)
CREATININE: 0.52 mg/dL — AB (ref 0.61–1.24)
Calcium: 8.7 mg/dL — ABNORMAL LOW (ref 8.9–10.3)
GFR calc Af Amer: >60 mL/min (ref >60)
GFR calc non Af Amer: >60 mL/min (ref >60)
Glucose, Bld: 99 mg/dL (ref 65–99)
Potassium: 4.9 mmol/L (ref 3.5–5.1)
Sodium: 136 mmol/L (ref 135–145)

## 2017-02-02 LAB — CBC
HCT: 34 % — ABNORMAL LOW (ref 39.0–52.0)
Hemoglobin: 10.7 g/dL — ABNORMAL LOW (ref 13.0–17.0)
MCH: 28.2 pg (ref 26.0–34.0)
MCHC: 31.5 g/dL (ref 30.0–36.0)
MCV: 89.5 fL (ref 78.0–100.0)
PLATELETS: 336 10*3/uL (ref 150–400)
RBC: 3.8 MIL/uL — AB (ref 4.22–5.81)
RDW: 16.8 % — AB (ref 11.5–15.5)
WBC: 8.4 10*3/uL (ref 4.0–10.5)

## 2017-02-02 LAB — PHOSPHORUS: Phosphorus: 3.4 mg/dL (ref 2.5–4.6)

## 2017-02-02 LAB — MAGNESIUM: Magnesium: 2 mg/dL (ref 1.7–2.4)

## 2017-02-06 NOTE — H&P (Signed)
PREOPERATIVE H&P  Chief Complaint: chronic respiratory problems  HPI: Adrian Cobb is a 83 y.o. male who presents for evaluation for trach placement. Patient with history of multiple intubations. He's presently extubated but has excessive supraglottic secretions that he had difficulty handling requiring chronic suctioning.He has history of CVA, and has chronic AFib on Eliquis. I was consulted to place tracheostomy.  Past Medical History:  Diagnosis Date  . Anemia   . Chronic interstitial lung disease (HCC)   . COPD (chronic obstructive pulmonary disease) (HCC)   . Dysrhythmia    a.fib  . GERD (gastroesophageal reflux disease)   . Pneumonia    aspiration  . Stroke (HCC)   . Thyroid storm    Past Surgical History:  Procedure Laterality Date  . TRACHEOSTOMY TUBE PLACEMENT N/A 01/22/2017   Procedure: TRACHEOSTOMY;  Surgeon: Drema HalonNewman, Recardo Linn E, MD;  [facility];  Service: ENT;  Laterality: N/A;   Social History   Social History  . Marital status: Married    Spouse name: N/A  . Number of children: N/A  . Years of education: N/A   Social History Main Topics  . Smoking status: None  . Smokeless tobacco: None  . Alcohol use None  . Drug use: Unknown  . Sexual activity: Not Asked   Other Topics Concern  . None   Social History Narrative  . None   History reviewed. No pertinent family history. Not on File Prior to Admission medications   Not on File     Positive ROS: neg  All other systems have been reviewed and were otherwise negative with the exception of those mentioned in the HPI and as above.  Physical Exam: There were no vitals filed for this visit.  General: Extubated and poorly responsive Oral: Normal oral mucosa and tonsils Nasal: Clear nasal passages Neck: No palpable adenopathy or thyroid nodules Cardiovascular: IRR Respiratory: Clear to auscultation   Assessment/Plan: aspiration  Plan for Procedure(s): TRACHEOSTOMY   Adrian CannonHRISTOPHER Tryniti Laatsch,  MD 02/06/2017 9:18 AM

## 2017-02-11 LAB — CBC
HEMATOCRIT: 32.4 % — AB (ref 39.0–52.0)
Hemoglobin: 9.8 g/dL — ABNORMAL LOW (ref 13.0–17.0)
MCH: 27.1 pg (ref 26.0–34.0)
MCHC: 30.2 g/dL (ref 30.0–36.0)
MCV: 89.8 fL (ref 78.0–100.0)
Platelets: 394 10*3/uL (ref 150–400)
RBC: 3.61 MIL/uL — ABNORMAL LOW (ref 4.22–5.81)
RDW: 17.3 % — AB (ref 11.5–15.5)
WBC: 6.6 10*3/uL (ref 4.0–10.5)

## 2017-02-11 LAB — BASIC METABOLIC PANEL
Anion gap: 9 (ref 5–15)
BUN: 72 mg/dL — AB (ref 6–20)
CALCIUM: 9.4 mg/dL (ref 8.9–10.3)
CO2: 37 mmol/L — AB (ref 22–32)
Chloride: 98 mmol/L — ABNORMAL LOW (ref 101–111)
Creatinine, Ser: 0.9 mg/dL (ref 0.61–1.24)
GFR calc non Af Amer: >60 mL/min (ref >60)
Glucose, Bld: 99 mg/dL (ref 65–99)
Potassium: 4.4 mmol/L (ref 3.5–5.1)
SODIUM: 144 mmol/L (ref 135–145)

## 2017-02-11 LAB — MAGNESIUM: Magnesium: 2.8 mg/dL — ABNORMAL HIGH (ref 1.7–2.4)

## 2017-02-11 LAB — PHOSPHORUS: PHOSPHORUS: 5.2 mg/dL — AB (ref 2.5–4.6)

## 2017-02-16 ENCOUNTER — Other Ambulatory Visit (HOSPITAL_COMMUNITY): Payer: Self-pay

## 2017-02-16 LAB — T4, FREE: Free T4: 1.6 ng/dL — ABNORMAL HIGH (ref 0.61–1.12)

## 2017-02-16 LAB — TSH: TSH: 0.178 u[IU]/mL — AB (ref 0.350–4.500)

## 2017-02-17 ENCOUNTER — Other Ambulatory Visit (HOSPITAL_COMMUNITY): Payer: Self-pay

## 2017-02-17 LAB — BLOOD GAS, ARTERIAL
ACID-BASE EXCESS: 8 mmol/L — AB (ref 0.0–2.0)
Acid-Base Excess: 7 mmol/L — ABNORMAL HIGH (ref 0.0–2.0)
BICARBONATE: 33.2 mmol/L — AB (ref 20.0–28.0)
Bicarbonate: 33.1 mmol/L — ABNORMAL HIGH (ref 20.0–28.0)
FIO2: 100
FIO2: 100
MECHVT: 450 mL
MECHVT: 450 mL
O2 SAT: 98.5 %
O2 Saturation: 99.2 %
PATIENT TEMPERATURE: 98.6
PCO2 ART: 67.1 mmHg — AB (ref 32.0–48.0)
PEEP/CPAP: 5 cmH2O
PEEP: 5 cmH2O
PH ART: 7.313 — AB (ref 7.350–7.450)
PO2 ART: 131 mmHg — AB (ref 83.0–108.0)
Patient temperature: 98.6
RATE: 18 resp/min
RATE: 22 resp/min
pCO2 arterial: 57.1 mmHg — ABNORMAL HIGH (ref 32.0–48.0)
pH, Arterial: 7.382 (ref 7.350–7.450)
pO2, Arterial: 211 mmHg — ABNORMAL HIGH (ref 83.0–108.0)

## 2017-02-17 LAB — CBC
HCT: 29 % — ABNORMAL LOW (ref 39.0–52.0)
Hemoglobin: 8.3 g/dL — ABNORMAL LOW (ref 13.0–17.0)
MCH: 27 pg (ref 26.0–34.0)
MCHC: 28.6 g/dL — ABNORMAL LOW (ref 30.0–36.0)
MCV: 94.5 fL (ref 78.0–100.0)
PLATELETS: 157 10*3/uL (ref 150–400)
RBC: 3.07 MIL/uL — AB (ref 4.22–5.81)
RDW: 18.6 % — ABNORMAL HIGH (ref 11.5–15.5)
WBC: 25.3 10*3/uL — AB (ref 4.0–10.5)

## 2017-02-17 LAB — BASIC METABOLIC PANEL
Anion gap: 18 — ABNORMAL HIGH (ref 5–15)
Anion gap: 17 — ABNORMAL HIGH (ref 5–15)
BUN: 173 mg/dL — ABNORMAL HIGH (ref 6–20)
BUN: 186 mg/dL — AB (ref 6–20)
CALCIUM: 8.9 mg/dL (ref 8.9–10.3)
CHLORIDE: 110 mmol/L (ref 101–111)
CO2: 30 mmol/L (ref 22–32)
CO2: 32 mmol/L (ref 22–32)
CREATININE: 3.18 mg/dL — AB (ref 0.61–1.24)
CREATININE: 3.77 mg/dL — AB (ref 0.61–1.24)
Calcium: 8.5 mg/dL — ABNORMAL LOW (ref 8.9–10.3)
Chloride: 107 mmol/L (ref 101–111)
GFR calc Af Amer: 16 mL/min — ABNORMAL LOW (ref >60)
GFR calc non Af Amer: 14 mL/min — ABNORMAL LOW (ref >60)
GFR, EST AFRICAN AMERICAN: 19 mL/min — AB (ref >60)
GFR, EST NON AFRICAN AMERICAN: 17 mL/min — AB (ref >60)
GLUCOSE: 98 mg/dL (ref 65–99)
Glucose, Bld: 100 mg/dL — ABNORMAL HIGH (ref 65–99)
Potassium: 6.7 mmol/L (ref 3.5–5.1)
Potassium: 5.1 mmol/L (ref 3.5–5.1)
SODIUM: 155 mmol/L — AB (ref 135–145)
Sodium: 159 mmol/L — ABNORMAL HIGH (ref 135–145)

## 2017-02-17 LAB — URINALYSIS, ROUTINE W REFLEX MICROSCOPIC
Bilirubin Urine: NEGATIVE
Glucose, UA: NEGATIVE mg/dL
Hgb urine dipstick: NEGATIVE
KETONES UR: NEGATIVE mg/dL
NITRITE: NEGATIVE
PROTEIN: NEGATIVE mg/dL
Specific Gravity, Urine: 1.016 (ref 1.005–1.030)
pH: 5 (ref 5.0–8.0)

## 2017-02-17 LAB — LACTIC ACID, PLASMA: LACTIC ACID, VENOUS: 6.1 mmol/L — AB (ref 0.5–1.9)

## 2017-02-17 LAB — MAGNESIUM: MAGNESIUM: 4 mg/dL — AB (ref 1.7–2.4)

## 2017-02-18 ENCOUNTER — Other Ambulatory Visit (HOSPITAL_COMMUNITY): Payer: Self-pay

## 2017-02-18 LAB — CBC
HCT: 26.4 % — ABNORMAL LOW (ref 39.0–52.0)
HEMOGLOBIN: 7.8 g/dL — AB (ref 13.0–17.0)
MCH: 27 pg (ref 26.0–34.0)
MCHC: 29.5 g/dL — AB (ref 30.0–36.0)
MCV: 91.3 fL (ref 78.0–100.0)
PLATELETS: 195 10*3/uL (ref 150–400)
RBC: 2.89 MIL/uL — ABNORMAL LOW (ref 4.22–5.81)
RDW: 18.6 % — ABNORMAL HIGH (ref 11.5–15.5)
WBC: 33.5 10*3/uL — ABNORMAL HIGH (ref 4.0–10.5)

## 2017-02-18 LAB — MAGNESIUM: Magnesium: 3.8 mg/dL — ABNORMAL HIGH (ref 1.7–2.4)

## 2017-02-18 LAB — BASIC METABOLIC PANEL
Anion gap: 14 (ref 5–15)
BUN: 195 mg/dL — AB (ref 6–20)
CHLORIDE: 108 mmol/L (ref 101–111)
CO2: 31 mmol/L (ref 22–32)
CREATININE: 4.07 mg/dL — AB (ref 0.61–1.24)
Calcium: 8.2 mg/dL — ABNORMAL LOW (ref 8.9–10.3)
GFR calc Af Amer: 14 mL/min — ABNORMAL LOW (ref >60)
GFR calc non Af Amer: 12 mL/min — ABNORMAL LOW (ref >60)
Glucose, Bld: 251 mg/dL — ABNORMAL HIGH (ref 65–99)
Potassium: 3.7 mmol/L (ref 3.5–5.1)
Sodium: 153 mmol/L — ABNORMAL HIGH (ref 135–145)

## 2017-02-18 LAB — POTASSIUM: POTASSIUM: 5 mmol/L (ref 3.5–5.1)

## 2017-02-18 LAB — URINE CULTURE: CULTURE: NO GROWTH

## 2017-02-18 LAB — OCCULT BLOOD X 1 CARD TO LAB, STOOL: Fecal Occult Bld: POSITIVE — AB

## 2017-02-18 NOTE — Consult Note (Signed)
Date: 02/18/2017               Patient Name:  Adrian Cobb MRN: [id number]  DOB: 03/08/1932 Age / Sex: 83 y.o., male        PCP: Patient, No Pcp Per              Service Requesting Consult: [facility] at [facility]              Reason for Consult: ARF       History of Present Illness: Patient is a 83 y.o. male with medical problems of COPD, atrial fibrillation, thyroid storm, chronic interstitial lung disease, GERD, anemia, stroke, who was admitted to [facility] on 01/22/2017 for management of acute respiratory failure. Patient is not able to provide any information. All information is obtained from the chart. Patient was transferred from outside hospital for weaning from ventilator. His hospital stay was complicated by atrial fibrillation, thyroid storm, altered mental status requiring intubation, PEG placement, GI bleed with endoscopy revealing gastritis, aspiration pneumonia, blood transfusion  Case discussed with patient's daughter. She states that prior to his hospitalization patient was able to walk and he was functional. He was diagnosed with thyroid storm and hospitalized for a few days then discharged to rehabilitation. From rehabilitation, [facility] was [facility] and had to be hospitalized in the ICU  Baseline creatinine 0.52 from 02/02/2017. Since then, it appears that his creatinine has been increasing  Most recent results from today show creatinine has increased to 4.07, BUN 195. Renal ultrasound done earlier today shows increased echogenicity, 8 mm septated cyst in the right kidney, 1.5 cm septated cyst in the left kidney. No obstruction. Patient's urine output has dropped therefore nephrology consult has been requested for evaluation of acute renal failure and to evaluate for dialysis as per family request.   Medications: Outpatient medications: No prescriptions prior to admission.    Current medications: No current facility-administered medications for this encounter.        Allergies: Not on File    Past Medical History: Past Medical History:  Diagnosis Date  . Anemia   . Chronic interstitial lung disease (HCC)   . COPD (chronic obstructive pulmonary disease) (HCC)   . Dysrhythmia    a.fib  . GERD (gastroesophageal reflux disease)   . Pneumonia    aspiration  . Stroke (HCC)   . Thyroid storm      Past Surgical History: Past Surgical History:  Procedure Laterality Date  . TRACHEOSTOMY TUBE PLACEMENT N/A 01/22/2017   Procedure: TRACHEOSTOMY;  Surgeon: Drema HalonNewman, Christopher E, MD;  [facility];  Service: ENT;  Laterality: N/A;     Family History: No family history on file.   Social History: Social History   Social History  . Marital status: Married    Spouse name: N/A  . Number of children: N/A  . Years of education: N/A   Occupational History  . Not on file.   Social History Main Topics  . Smoking status: Not on file  . Smokeless tobacco: Not on file  . Alcohol use Not on file  . Drug use: Unknown  . Sexual activity: Not on file   Other Topics Concern  . Not on file   Social History Narrative  . No narrative on file     Review of Systems:Not available Gen:  HEENT:  CV:  Resp:  GI: GU :  MS:  Derm:   Psych: Heme:  Neuro:  Endocrine  Vital Signs:  Temperature  96.2, pulse 115, irregular, atrial fibrillation, blood pressure 112/55, respirations 37-46 Ventilator FiO2 35%, PEEP 5  Physical Exam: General:  Thin, cachectic gentleman, laying in the bed   HEENT Anicteric   Neck:  Tracheostomy in place   Lungs: Ventilator assisted, coarse breath sounds bilaterally   Heart::  Tachycardic, irregular    Abdomen: soft, nondistended, PEG tube in place  Extremities:  No edema, significant generalized muscle wasting   Neurologic: Not following commands   Skin: Decreased turgor      Foley: In place        Lab results: Basic Metabolic Panel:  Recent Labs Lab 02/17/17 0614 02/17/17 1526  02/17/17 2305 02/17/17 2306 02/18/17 0659  NA 155* 159*  --   --  153*  K 6.7* 5.1  --  5.0 3.7  CL 107 110  --   --  108  CO2 30 32  --   --  31  GLUCOSE 100* 98  --   --  251*  BUN 173* 186*  --   --  195*  CREATININE 3.18* 3.77*  --   --  4.07*  CALCIUM 8.5* 8.9  --   --  8.2*  MG 4.0*  --  3.8*  --   --     Liver Function Tests: No results for input(s): AST, ALT, ALKPHOS, BILITOT, PROT, ALBUMIN in the last 168 hours. No results for input(s): LIPASE, AMYLASE in the last 168 hours. No results for input(s): AMMONIA in the last 168 hours.  CBC:  Recent Labs Lab 02/17/17 0614 02/18/17 0659  WBC 25.3* 33.5*  HGB 8.3* 7.8*  HCT 29.0* 26.4*  MCV 94.5 91.3  PLT 157 195    Cardiac Enzymes: No results for input(s): CKTOTAL, TROPONINI in the last 168 hours.  BNP: Invalid input(s): POCBNP  CBG: No results for input(s): GLUCAP in the last 168 hours.  Microbiology: Recent Results (from the past 720 hour(s))  Culture, Urine     Status: None   Collection Time: 02/17/17  8:30 AM  Result Value Ref Range Status   Specimen Description URINE, RANDOM  Final   Special Requests NONE  Final   Culture NO GROWTH  Final   Report Status 02/18/2017 FINAL  Final  Culture, blood (routine x 2)     Status: None (Preliminary result)   Collection Time: 02/17/17  9:36 AM  Result Value Ref Range Status   Specimen Description BLOOD LEFT HAND  Final   Special Requests IN PEDIATRIC BOTTLE Blood Culture adequate volume  Final   Culture  Setup Time   Final    GRAM POSITIVE COCCI IN CLUSTERS IN PEDIATRIC BOTTLE CRITICAL RESULT CALLED TO, READ BACK BY AND VERIFIED WITH: R.TURGOTT, RN 02/18/17 0535 L.CHAMPION    Culture   Final    GRAM POSITIVE COCCI IN CLUSTERS CULTURE REINCUBATED FOR BETTER GROWTH    Report Status PENDING  Incomplete  Culture, blood (routine x 2)     Status: None (Preliminary result)   Collection Time: 02/17/17  9:43 AM  Result Value Ref Range Status   Specimen Description  BLOOD RIGHT HAND  Final   Special Requests IN PEDIATRIC BOTTLE Blood Culture adequate volume  Final   Culture  Setup Time   Final    GRAM POSITIVE COCCI IN CLUSTERS IN PEDIATRIC BOTTLE CRITICAL VALUE NOTED.  VALUE IS CONSISTENT WITH PREVIOUSLY REPORTED AND CALLED VALUE.    Culture   Final    GRAM POSITIVE COCCI IN CLUSTERS CULTURE REINCUBATED FOR  BETTER GROWTH    Report Status PENDING  Incomplete  Culture, respiratory (NON-Expectorated)     Status: None (Preliminary result)   Collection Time: 02/17/17 11:11 AM  Result Value Ref Range Status   Specimen Description TRACHEAL ASPIRATE  Final   Special Requests NONE  Final   Gram Stain   Final    ABUNDANT WBC PRESENT, PREDOMINANTLY PMN ABUNDANT GRAM POSITIVE COCCI MODERATE GRAM POSITIVE RODS FEW GRAM NEGATIVE RODS    Culture   Final    ABUNDANT STAPHYLOCOCCUS AUREUS SUSCEPTIBILITIES TO FOLLOW    Report Status PENDING  Incomplete     Coagulation Studies: No results for input(s): LABPROT, INR in the last 72 hours.  Urinalysis:  Recent Labs  02/17/17 0830  COLORURINE YELLOW  LABSPEC 1.016  PHURINE 5.0  GLUCOSEU NEGATIVE  HGBUR NEGATIVE  BILIRUBINUR NEGATIVE  KETONESUR NEGATIVE  PROTEINUR NEGATIVE  NITRITE NEGATIVE  LEUKOCYTESUR TRACE*        Imaging: Ct Chest Wo Contrast  Result Date: 02/17/2017 CLINICAL DATA:  83 year old male with history of oxygen desaturation. EXAM: CT CHEST WITHOUT CONTRAST TECHNIQUE: Multidetector CT imaging of the chest was performed following the standard protocol without IV contrast. COMPARISON:  No priors. FINDINGS: Cardiovascular: Heart size is normal. There is no significant pericardial fluid, thickening or pericardial calcification. There is aortic atherosclerosis, as well as atherosclerosis of the great vessels of the mediastinum and the coronary arteries, including calcified atherosclerotic plaque in the left main, left anterior descending, left circumflex and right coronary arteries.  Mediastinum/Nodes: Tracheostomy tube in position with tip in the proximal trachea approximately 6 cm above the carina. No pathologically enlarged mediastinal or hilar lymph nodes. Please note that accurate exclusion of hilar adenopathy is limited on noncontrast CT scans. Esophagus is unremarkable in appearance. No axillary lymphadenopathy. Lungs/Pleura: Airspace consolidation throughout the left lower lobe with multiple air bronchograms, concerning for pneumonia. There are additional areas of airspace consolidation, most evident in the peribronchovascular aspect of the dependent portions of the left upper lobe. Dependent atelectasis in the right lower lobe. Small left pleural effusion which appears partially loculated in the left major fissure and subpulmonic regions. Trace right pleural effusion. Diffuse bronchial wall thickening with mild paraseptal emphysema. Widespread areas of septal thickening, some scattered cylindrical and varicose bronchiectasis, peripheral bronchiolectasis, subpleural reticulation, an appearance suggestive of honeycombing, most evident throughout the mid to lower lungs bilaterally, concerning for interstitial lung disease. Upper Abdomen: Surgical clips near the gastroesophageal junction. Musculoskeletal: There are no aggressive appearing lytic or blastic lesions noted in the visualized portions of the skeleton. IMPRESSION: 1. Left lower lobe pneumonia with small left parapneumonic pleural effusion which appears partially loculated. 2. Additional peribronchovascular airspace disease, most evident in the dependent portion of the left upper lobe likely reflects additional focus of bronchopneumonia. 3. Mild diffuse bronchial wall thickening with mild paraseptal emphysema; imaging findings suggestive of underlying COPD. 4. In addition, there is a spectrum of findings highly concerning for interstitial lung disease, potential usual interstitial pneumonia (UIP). Follow-up nonemergent outpatient  high-resolution chest CT should be concern after resolution of the patient's acute pneumonia to better evaluate these findings. 5. Aortic atherosclerosis, in addition to left main and 3 vessel coronary artery disease. Aortic Atherosclerosis (ICD10-I70.0) and Emphysema (ICD10-J43.9). Electronically Signed   By: Trudie Reed M.D.   On: 02/17/2017 14:09   US Renal  Result Date: 02/18/2017 CLINICAL DATA:  Elevated creatinine level. EXAM: RENAL / URINARY TRACT ULTRASOUND COMPLETE COMPARISON:  None. FINDINGS: Right Kidney: Length: 10.6 cm. Mildly  increased echogenicity of renal parenchyma is noted suggesting medical renal disease. 8 mm simple cyst is noted in midpole. 1.7 cm septated cyst is noted in upper pole. No hydronephrosis visualized. Left Kidney: Length: 10.5 cm. Mildly increased echogenicity of renal parenchyma is noted suggesting medical renal disease. 1.5 cm septated cyst is noted in upper pole. 3.5 cm simple cyst is noted in midpole. No hydronephrosis visualized. Bladder: Foley catheter is noted.  Urinary bladder appears normal otherwise. IMPRESSION: Increased echogenicity of renal parenchyma is noted bilaterally suggesting medical renal disease. 8 mm septated cyst is seen in right kidney, with 1.5 cm septated cyst seen in left kidney, consistent with Bosniak type 2 lesions. Simple cysts are also noted bilaterally. Electronically Signed   By: Lupita Raider, M.D.   On: 02/18/2017 13:27   Dg Chest Port 1 View  Result Date: 02/17/2017 CLINICAL DATA:  Acute onset of respiratory distress. Initial encounter. EXAM: PORTABLE CHEST 1 VIEW COMPARISON:  Chest radiograph performed 02/16/2017 FINDINGS: A tracheostomy tube is seen ending 7 cm above the carina. Persistent bilateral airspace opacities are mildly worsened on the right, concerning for multifocal pneumonia or possibly pulmonary edema. A small left pleural effusion is noted. No pneumothorax is seen. The cardiomediastinal silhouette is mildly  enlarged. No acute osseous abnormalities are identified. IMPRESSION: Persistent bilateral airspace opacities are mildly worse on the right, concerning for multifocal pneumonia or possibly pulmonary edema. Small left pleural effusion noted. Mild cardiomegaly. Electronically Signed   By: Roanna Raider M.D.   On: 02/17/2017 04:16      Assessment & Plan: Pt is a 83 y.o. yo male with  medical problems of COPD, atrial fibrillation, thyroid storm, chronic interstitial lung disease, GERD, anemia, stroke, who was admitted to [facility] on 01/22/2017 for management of acute respiratory failure. Patient is not able to provide any information. All information is obtained from the chart. Patient was transferred from outside hospital for weaning from ventilator. His hospital stay was complicated by atrial fibrillation, thyroid storm, altered mental status requiring intubation, PEG placement, GI bleed with endoscopy revealing gastritis, aspiration pneumonia, blood transfusion   1. Acute renal failure, oliguric Now patient is becoming azotemic with BUN greater than 195, serum creatinine has increased to 107 Cause of acute renal failure is likely multifactorial including recent sepsis - Case discussed with patient's daughter. She is requesting aggressive care for the patient.  She wants to proceed with HD. - Requests VIR to place temporary dialysis catheter - Start dialysis once access is available  2. Sepsis, lactic acidosis, gram-positive bacteremia - Treatment as per primary team with broad-spectrum antibiotics - Currently requiring pressors  3. Generalized debility and Malnutrition - Patient is cachectic - Nutritional status is poor -He has a 6 cm sacrococcygeal decubitus  4. Acute respiratory failure - Requiring ventilator support - CT chest suggests pneumonia  5. Hypernatremia - Currently getting D5W @ 125 cc/hr Monitor Na closely

## 2017-02-19 ENCOUNTER — Other Ambulatory Visit (HOSPITAL_COMMUNITY): Payer: Self-pay

## 2017-02-19 DIAGNOSIS — D638 Anemia in other chronic diseases classified elsewhere: Secondary | ICD-10-CM

## 2017-02-19 DIAGNOSIS — R195 Other fecal abnormalities: Secondary | ICD-10-CM

## 2017-02-19 LAB — CULTURE, RESPIRATORY

## 2017-02-19 LAB — CBC
HEMATOCRIT: 25.3 % — AB (ref 39.0–52.0)
Hemoglobin: 7.8 g/dL — ABNORMAL LOW (ref 13.0–17.0)
MCH: 26.8 pg (ref 26.0–34.0)
MCHC: 30.8 g/dL (ref 30.0–36.0)
MCV: 86.9 fL (ref 78.0–100.0)
Platelets: 190 10*3/uL (ref 150–400)
RBC: 2.91 MIL/uL — ABNORMAL LOW (ref 4.22–5.81)
RDW: 18.8 % — AB (ref 11.5–15.5)
WBC: 23.8 10*3/uL — ABNORMAL HIGH (ref 4.0–10.5)

## 2017-02-19 LAB — BASIC METABOLIC PANEL
ANION GAP: 10 (ref 5–15)
ANION GAP: 13 (ref 5–15)
BUN: 162 mg/dL — ABNORMAL HIGH (ref 6–20)
BUN: 176 mg/dL — ABNORMAL HIGH (ref 6–20)
CALCIUM: 7.3 mg/dL — AB (ref 8.9–10.3)
CALCIUM: 7.7 mg/dL — AB (ref 8.9–10.3)
CHLORIDE: 92 mmol/L — AB (ref 101–111)
CO2: 31 mmol/L (ref 22–32)
CO2: 27 mmol/L (ref 22–32)
Chloride: 98 mmol/L — ABNORMAL LOW (ref 101–111)
Creatinine, Ser: 3.27 mg/dL — ABNORMAL HIGH (ref 0.61–1.24)
Creatinine, Ser: 3.23 mg/dL — ABNORMAL HIGH (ref 0.61–1.24)
GFR calc non Af Amer: 16 mL/min — ABNORMAL LOW (ref >60)
GFR, EST AFRICAN AMERICAN: 19 mL/min — AB (ref >60)
GFR, EST AFRICAN AMERICAN: 19 mL/min — AB (ref >60)
GFR, EST NON AFRICAN AMERICAN: 16 mL/min — AB (ref >60)
Glucose, Bld: 476 mg/dL — ABNORMAL HIGH (ref 65–99)
Glucose, Bld: 198 mg/dL — ABNORMAL HIGH (ref 65–99)
POTASSIUM: 3.8 mmol/L (ref 3.5–5.1)
Potassium: 3.2 mmol/L — ABNORMAL LOW (ref 3.5–5.1)
SODIUM: 133 mmol/L — AB (ref 135–145)
Sodium: 138 mmol/L (ref 135–145)

## 2017-02-19 LAB — CULTURE, RESPIRATORY W GRAM STAIN

## 2017-02-19 LAB — CULTURE, BLOOD (ROUTINE X 2): Special Requests: ADEQUATE

## 2017-02-19 NOTE — Consult Note (Signed)
Gastroenterology Consult: 11:37 AM 02/19/2017  LOS: 0 days    Referring Provider:  Dr  Sharyon Medicus  Primary Care Physician:   Primary Gastroenterologist:  Gentry Fitz pt on [facility]     Reason for Consultation:  Anemia, FOBT+   HPI: Adrian Cobb is a 83 y.o. male.  PMH interstitial lung disease, COPD. Atrial fibrillation on [place] course. GERD. Aspiration pneumonia. CVA. Thyroid storm. Anemia. Advance CKD, stage IV by current measure.  DM, type 2.   Patient was admitted to [facility] in [place], [place] in 12/2016. He was admitted with acute on chronic respiratory failure from a nursing home. He had A. fib with rapid rate due to thyroid storm and was started on Synthroid. Echocardiogram showed his EF to be 30 - 35%.  He was on and off ventilator for respiratory failure. Due to dysphagia PEG feeding tube was placed. The endoscopy was also performed to evaluate heme positive stool and blood from the G-tube site. The EGD showed gastritis. He was transfused for anemia with at least 2 units of packed cells He suffered acute kidney injury. He was treated for aspiration pneumonia. He had funguria. Had sacral decubitus ulcer.  Severe protein calorie malnutrition is an ongoing problem   Transferred to [facility] for vent wean.  Has had recurrent aspiration both before and since he was transferred to [facility] and ultimately underwent tracheotomy 01/22/17 by Dr. Ezzard Standing.  Patient's anemia has been a problem since the beginning of his admission. His hemoglobin was 7.3 at admission with MCV of 101. He's been transfused. Hemoglobin reached a max of 10 point 7 on 02/02/17. It has since declined steadily and is 7.8 today. Normal platelets. White blood cell count is elevated.  Stool is FOBT positive but not bloody or melenic. He received 1 unit of packed red cells on 7/11  and another 2 units on 7/19.   His GFR is 16. A CT of the chest shows pneumonia in the left lower lobe with loculated effusion. Left sided bronchopneumonia COPD. Interstitial lung disease.  Aortic atherosclerosis with left main and three-vessel CAD.   Patient had been receiving twice daily oral Protonix but this was switched to twice daily IV Protonix yesterday.  Status of previous colonoscopies or endoscopies unknown except for the one EGD at [facility] in June.    Past Medical History:  Diagnosis Date  . Anemia   . Chronic interstitial lung disease (HCC)   . COPD (chronic obstructive pulmonary disease) (HCC)   . Dysrhythmia    a.fib  . GERD (gastroesophageal reflux disease)   . Pneumonia    aspiration  . Stroke (HCC)   . Thyroid storm     Past Surgical History:  Procedure Laterality Date  . TRACHEOSTOMY TUBE PLACEMENT N/A 01/22/2017   Procedure: TRACHEOSTOMY;  Surgeon: Drema Halon, MD;  Location: [facility] OR;  Service: ENT;  Laterality: N/A;    Prior to Admission medications   Not on File    Scheduled Meds: Levothroid. Zosyn. Humalog insulin.  Atenolol 12 point 5 mg daily. Budesonide inhaler. Ferrous sulfate 300/60 F  E 300 mg twice a day. [place] store to 51 by mouth twice a day. Guaifenesin. Methimazole 7.5 mg twice a day. Mirtazapine 7.5 milligrams daily at bedtime. Protonix 40 mg IV twice a day. Pro-stat protein supplement twice a day. Transdermal scopolamine 1.5 milligrams every 72 hours. Vancomycin IV. Vitamin C tablets. Sync sulfate tablets.    Allergies as of 01/22/2017  . (Not on File)    No family history on file.  Unable to obtain any past family medical history.  Social History   Social History  . Marital status: Married    Spouse name: N/A  . Number of children: N/A  . Years of education: N/A   Occupational History  . Not on file.   Social History Main Topics  . Smoking status: Not on file  . Smokeless tobacco: Not on file  . Alcohol use Not on  file  . Drug use: Unknown  . Sexual activity: Not on file   Other Topics Concern  . Not on file   Social History Narrative  . No narrative on file    REVIEW OF SYSTEMS: Patient is essentially comatose and is unable to provide me any history. He is not responding to exam..    PHYSICAL EXAM: Vital signs in last 24 hours: There were no vitals filed for this visit. Wt Readings from Last 3 Encounters:  No data found for Wt  Weight 111.9 #   blood pressure 103/52.    Heart rate 114   respirations 24.     Oxygen saturation 98%   General: Skeletal, extremely cachectic and malnourished, frail aged gentleman on the vent via trach. Head:  Skeletal with temporal wasting. No asymmetry. No signs of head trauma.  Eyes:  Conjunctiva not pale. No scleral icterus. Ears:  Unable to assess hearing as he is not responsive to words or exam.  Nose:  No discharge or congestion. Mouth:  Edentulous. Mucous membranes slightly dry. Unable to fully assess oral cavity as he isn't able to open his mouth and I did not want to try it open. Neck:  No JVD or masses. No thyromegaly.  Tracheostomy in place Lungs:  Rhonchorous breath sounds. Patient on vent via trach Heart:  RRR. No MRG. S1, S2 present. Abdomen:  Scaphoid. PEG site not bleeding and not erythematous, essentially benign and positioned in the mid to lower left of center..   Rectal:  Tan, soft stool is very slightly FOBT positive.   Musc/Skeltl:  Scar consistent with knee replacement on the right. Overall contracted but not fetal posture. Kyphosis. Extremities:  Slight, nonpitting pedal/ankle edema  Neurologic:  Patient does not respond to exam or to my questions or voice. There is no tremor. No involuntary movement or spontaneous movement. Skin:  Thin. Color is ashen/pale. I go on the right thigh Tattoos:  None observed Nodes:  No cervical adenopathy   Psych:  Unable to assess in this unresponsive patient  Intake/Output from previous day: No  intake/output data recorded. Intake/Output this shift: No intake/output data recorded.  LAB RESULTS:  Recent Labs  02/17/17 0614 02/18/17 0659 02/19/17 0616  WBC 25.3* 33.5* 23.8*  HGB 8.3* 7.8* 7.8*  HCT 29.0* 26.4* 25.3*  PLT 157 195 190   BMET Lab Results  Component Value Date   NA 133 (L) 02/19/2017   NA 153 (H) 02/18/2017   NA 159 (H) 02/17/2017   K 3.2 (L) 02/19/2017   K 3.7 02/18/2017   K 5.0 02/17/2017   CL 92 (L)  02/19/2017   CL 108 02/18/2017   CL 110 02/17/2017   CO2 31 02/19/2017   CO2 31 02/18/2017   CO2 32 02/17/2017   GLUCOSE 476 (H) 02/19/2017   GLUCOSE 251 (H) 02/18/2017   GLUCOSE 98 02/17/2017   BUN 162 (H) 02/19/2017   BUN 195 (H) 02/18/2017   BUN 186 (H) 02/17/2017   CREATININE 3.27 (H) 02/19/2017   CREATININE 4.07 (H) 02/18/2017   CREATININE 3.77 (H) 02/17/2017   CALCIUM 7.3 (L) 02/19/2017   CALCIUM 8.2 (L) 02/18/2017   CALCIUM 8.9 02/17/2017   LFT No results for input(s): PROT, ALBUMIN, AST, ALT, ALKPHOS, BILITOT, BILIDIR, IBILI in the last 72 hours. PT/INR Lab Results  Component Value Date   INR 2.58 12/26/2016     RADIOLOGY STUDIES: Ct Chest Wo Contrast  Result Date: 02/17/2017 CLINICAL DATA:  83 year old male with history of oxygen desaturation. EXAM: CT CHEST WITHOUT CONTRAST TECHNIQUE: Multidetector CT imaging of the chest was performed following the standard protocol without IV contrast. COMPARISON:  No priors. FINDINGS: Cardiovascular: Heart size is normal. There is no significant pericardial fluid, thickening or pericardial calcification. There is aortic atherosclerosis, as well as atherosclerosis of the great vessels of the mediastinum and the coronary arteries, including calcified atherosclerotic plaque in the left main, left anterior descending, left circumflex and right coronary arteries. Mediastinum/Nodes: Tracheostomy tube in position with tip in the proximal trachea approximately 6 cm above the carina. No pathologically  enlarged mediastinal or hilar lymph nodes. Please note that accurate exclusion of hilar adenopathy is limited on noncontrast CT scans. Esophagus is unremarkable in appearance. No axillary lymphadenopathy. Lungs/Pleura: Airspace consolidation throughout the left lower lobe with multiple air bronchograms, concerning for pneumonia. There are additional areas of airspace consolidation, most evident in the peribronchovascular aspect of the dependent portions of the left upper lobe. Dependent atelectasis in the right lower lobe. Small left pleural effusion which appears partially loculated in the left major fissure and subpulmonic regions. Trace right pleural effusion. Diffuse bronchial wall thickening with mild paraseptal emphysema. Widespread areas of septal thickening, some scattered cylindrical and varicose bronchiectasis, peripheral bronchiolectasis, subpleural reticulation, an appearance suggestive of honeycombing, most evident throughout the mid to lower lungs bilaterally, concerning for interstitial lung disease. Upper Abdomen: Surgical clips near the gastroesophageal junction. Musculoskeletal: There are no aggressive appearing lytic or blastic lesions noted in the visualized portions of the skeleton. IMPRESSION: 1. Left lower lobe pneumonia with small left parapneumonic pleural effusion which appears partially loculated. 2. Additional peribronchovascular airspace disease, most evident in the dependent portion of the left upper lobe likely reflects additional focus of bronchopneumonia. 3. Mild diffuse bronchial wall thickening with mild paraseptal emphysema; imaging findings suggestive of underlying COPD. 4. In addition, there is a spectrum of findings highly concerning for interstitial lung disease, potential usual interstitial pneumonia (UIP). Follow-up nonemergent outpatient high-resolution chest CT should be concern after resolution of the patient's acute pneumonia to better evaluate these findings. 5. Aortic  atherosclerosis, in addition to left main and 3 vessel coronary artery disease. Aortic Atherosclerosis (ICD10-I70.0) and Emphysema (ICD10-J43.9). Electronically Signed   By: Trudie Reedaniel  Entrikin M.D.   On: 02/17/2017 14:09   [place] Renal  Result Date: 02/18/2017 CLINICAL DATA:  Elevated creatinine level. EXAM: RENAL / URINARY TRACT ULTRASOUND COMPLETE COMPARISON:  None. FINDINGS: Right Kidney: Length: 10.6 cm. Mildly increased echogenicity of renal parenchyma is noted suggesting medical renal disease. 8 mm simple cyst is noted in midpole. 1.7 cm septated cyst is noted in upper pole. No hydronephrosis  visualized. Left Kidney: Length: 10.5 cm. Mildly increased echogenicity of renal parenchyma is noted suggesting medical renal disease. 1.5 cm septated cyst is noted in upper pole. 3.5 cm simple cyst is noted in midpole. No hydronephrosis visualized. Bladder: Foley catheter is noted.  Urinary bladder appears normal otherwise. IMPRESSION: Increased echogenicity of renal parenchyma is noted bilaterally suggesting medical renal disease. 8 mm septated cyst is seen in right kidney, with 1.5 cm septated cyst seen in left kidney, consistent with Bosniak type 2 lesions. Simple cysts are also noted bilaterally. Electronically Signed   By: Lupita Raider, M.D.   On: 02/18/2017 13:27    IMPRESSION:   *  Acute on chronic anemia. Recurrent. FOBT positive stool without gross bleeding, recurrent. Upper endoscopy in June in [place] revealed gastritis. PEG feeding tube placed in [place].   Patient has late stage chronic kidney disease versus unresolved acute kidney injury. GFR of 14 - 19.  There is certainly anemia of chronic disease and of renal failure. Patient is not fit enough to undergo colonoscopy and repeating upper endoscopy is of questionable benefit.  *  Vent dependent respiratory failure in patient with underlying COPD/interstitial lung disease and superimposed recurrences of aspiration pneumonia.  *  History of  thyroid storm 12/2016. Started on thyroid hormone replacement then.  TSH was checked on 02/16/17 and was still low at 0.178 with elevated free T4 at 1.6.  *  Left ventricular ejection fraction of 30 to 35% on 12/2016 echocardiogram in [place]    PLAN:     *  Per Dr Russella Dar *  Would consider palliative care, comfort care approach to this pt's management.     Jennye Moccasin  02/19/2017, 11:37 AM Pager: [phone]      Attending physician's note   I have taken a history, examined the patient and reviewed the chart. I agree with the Advanced Practitioner's note, impression and recommendations. Chronically ill, debilitated, vent dependent resp failure, non communicative patient with a multifactorial anemia and occult blood in stool. No overt GI bleeding has been noted. Suspect gastritis noted on EGD in June is the causes of occult blood in stool. He is not fit for colonoscopy and I do not feel that repeating his EGD is necessary as he underwent EGD in June. Consider palliative care, comfort care. PPI bid IV or per G-tube. Transfuse as needed. GI signing off.   Claudette Head, MD Clementeen Graham [phone] Mon-Fri 8a-5p [phone] after 5p, weekends, holidays

## 2017-02-20 LAB — RENAL FUNCTION PANEL
ANION GAP: 14 (ref 5–15)
Albumin: 1.3 g/dL — ABNORMAL LOW (ref 3.5–5.0)
BUN: 164 mg/dL — ABNORMAL HIGH (ref 6–20)
CO2: 26 mmol/L (ref 22–32)
Calcium: 7.6 mg/dL — ABNORMAL LOW (ref 8.9–10.3)
Chloride: 97 mmol/L — ABNORMAL LOW (ref 101–111)
Creatinine, Ser: 2.99 mg/dL — ABNORMAL HIGH (ref 0.61–1.24)
GFR calc Af Amer: 21 mL/min — ABNORMAL LOW (ref >60)
GFR calc non Af Amer: 18 mL/min — ABNORMAL LOW (ref >60)
Glucose, Bld: 177 mg/dL — ABNORMAL HIGH (ref 65–99)
POTASSIUM: 3.4 mmol/L — AB (ref 3.5–5.1)
Phosphorus: 5.5 mg/dL — ABNORMAL HIGH (ref 2.5–4.6)
Sodium: 137 mmol/L (ref 135–145)

## 2017-02-20 LAB — CBC
HEMATOCRIT: 23.4 % — AB (ref 39.0–52.0)
HEMOGLOBIN: 7.2 g/dL — AB (ref 13.0–17.0)
MCH: 26.8 pg (ref 26.0–34.0)
MCHC: 30.8 g/dL (ref 30.0–36.0)
MCV: 87 fL (ref 78.0–100.0)
Platelets: 192 10*3/uL (ref 150–400)
RBC: 2.69 MIL/uL — ABNORMAL LOW (ref 4.22–5.81)
RDW: 18.7 % — ABNORMAL HIGH (ref 11.5–15.5)
WBC: 22.5 10*3/uL — ABNORMAL HIGH (ref 4.0–10.5)

## 2017-02-20 LAB — CULTURE, BLOOD (ROUTINE X 2): Special Requests: ADEQUATE

## 2017-02-20 LAB — HEPATITIS B SURFACE ANTIBODY,QUALITATIVE: HEP B S AB: NONREACTIVE

## 2017-02-20 LAB — HEPATITIS B CORE ANTIBODY, TOTAL: Hep B Core Total Ab: NEGATIVE

## 2017-02-20 LAB — HEPATITIS B SURFACE ANTIGEN: HEP B S AG: NEGATIVE

## 2017-02-20 NOTE — Progress Notes (Signed)
[facility] [place], [place] 02/20/17  Subjective:   Patient is doing fair. However, he remains critically ill requiring ventilator support Urine output has improved significantly therefore dialysis was deferred. Urine output 1850 cc last 24 hours Currently also has a full Foley bag Serum sodium now 137 Serum creatinine down to 2.99, BUN down to 164. Albumin remains significantly low at 1.3 Requiring norepinephrine for blood pressure support  Objective:  Vital signs in last 24 hours:    temperature 99.1, pulse 118, irregular, respirations 31, blood pressure 106/53 Ventilator: FiO2 35%, PEEP 5  Weight change:  There were no vitals filed for this visit.  Intake/Output:   No intake or output data in the 24 hours ending 02/20/17 1644   Physical Exam: General: Critically ill-appearing, cachectic gentleman, laying in the bed   HEENT Sunken eyes, temporal wasting   Neck Tracheostomy in place   Pulm/lungs Ventilator assisted   CVS/Heart Tachycardic, irregular   Abdomen:  Soft   Extremities: No significant edema   Neurologic: Lethargic, not following commands   Skin: Decreased turgor    Foley in place        Basic Metabolic Panel:   Recent Labs Lab 02/17/17 0614 02/17/17 1526 02/17/17 2305 02/17/17 2306 02/18/17 0659 02/19/17 0616 02/19/17 1757 02/20/17 0632  NA 155* 159*  --   --  153* 133* 138 137  K 6.7* 5.1  --  5.0 3.7 3.2* 3.8 3.4*  CL 107 110  --   --  108 92* 98* 97*  CO2 30 32  --   --  31 31 27 26   GLUCOSE 100* 98  --   --  251* 476* 198* 177*  BUN 173* 186*  --   --  195* 162* 176* 164*  CREATININE 3.18* 3.77*  --   --  4.07* 3.27* 3.23* 2.99*  CALCIUM 8.5* 8.9  --   --  8.2* 7.3* 7.7* 7.6*  MG 4.0*  --  3.8*  --   --   --   --   --   PHOS  --   --   --   --   --   --   --  5.5*     CBC:  Recent Labs Lab 02/17/17 0614 02/18/17 0659 02/19/17 0616 02/20/17 0632  WBC 25.3* 33.5* 23.8* 22.5*  HGB 8.3* 7.8* 7.8* 7.2*  HCT  29.0* 26.4* 25.3* 23.4*  MCV 94.5 91.3 86.9 87.0  PLT 157 195 190 192     Lab Results  Component Value Date   HEPBSAG Negative 02/19/2017   HEPBSAB Non Reactive 02/19/2017      Microbiology:  Recent Results (from the past 240 hour(s))  Culture, Urine     Status: None   Collection Time: 02/17/17  8:30 AM  Result Value Ref Range Status   Specimen Description URINE, RANDOM  Final   Special Requests NONE  Final   Culture NO GROWTH  Final   Report Status 02/18/2017 FINAL  Final  Culture, blood (routine x 2)     Status: Abnormal   Collection Time: 02/17/17  9:36 AM  Result Value Ref Range Status   Specimen Description BLOOD LEFT HAND  Final   Special Requests IN PEDIATRIC BOTTLE Blood Culture adequate volume  Final   Culture  Setup Time   Final    GRAM POSITIVE COCCI IN CLUSTERS IN PEDIATRIC BOTTLE CRITICAL RESULT CALLED TO, READ BACK BY AND VERIFIED WITH: R.TURGOTT, RN 02/18/17 0535 L.CHAMPION  Culture (A)  Final    STAPHYLOCOCCUS SPECIES (COAGULASE NEGATIVE) THE SIGNIFICANCE OF ISOLATING THIS ORGANISM FROM A SINGLE SET OF BLOOD CULTURES WHEN MULTIPLE SETS ARE DRAWN IS UNCERTAIN. PLEASE NOTIFY THE MICROBIOLOGY DEPARTMENT WITHIN ONE WEEK IF SPECIATION AND SENSITIVITIES ARE REQUIRED.    Report Status 02/19/2017 FINAL  Final  Culture, blood (routine x 2)     Status: Abnormal   Collection Time: 02/17/17  9:43 AM  Result Value Ref Range Status   Specimen Description BLOOD RIGHT HAND  Final   Special Requests IN PEDIATRIC BOTTLE Blood Culture adequate volume  Final   Culture  Setup Time   Final    GRAM POSITIVE COCCI IN CLUSTERS IN PEDIATRIC BOTTLE CRITICAL VALUE NOTED.  VALUE IS CONSISTENT WITH PREVIOUSLY REPORTED AND CALLED VALUE.    Culture METHICILLIN RESISTANT STAPHYLOCOCCUS AUREUS (A)  Final   Report Status 02/20/2017 FINAL  Final   Organism ID, Bacteria METHICILLIN RESISTANT STAPHYLOCOCCUS AUREUS  Final      Susceptibility   Methicillin resistant staphylococcus aureus  - MIC*    CIPROFLOXACIN >=8 RESISTANT Resistant     ERYTHROMYCIN >=8 RESISTANT Resistant     GENTAMICIN <=0.5 SENSITIVE Sensitive     OXACILLIN >=4 RESISTANT Resistant     TETRACYCLINE >=16 RESISTANT Resistant     VANCOMYCIN <=0.5 SENSITIVE Sensitive     TRIMETH/SULFA <=10 SENSITIVE Sensitive     CLINDAMYCIN RESISTANT Resistant     RIFAMPIN <=0.5 SENSITIVE Sensitive     Inducible Clindamycin POSITIVE Resistant     * METHICILLIN RESISTANT STAPHYLOCOCCUS AUREUS  Culture, respiratory (NON-Expectorated)     Status: None   Collection Time: 02/17/17 11:11 AM  Result Value Ref Range Status   Specimen Description TRACHEAL ASPIRATE  Final   Special Requests NONE  Final   Gram Stain   Final    ABUNDANT WBC PRESENT, PREDOMINANTLY PMN ABUNDANT GRAM POSITIVE COCCI MODERATE GRAM POSITIVE RODS FEW GRAM NEGATIVE RODS    Culture   Final    ABUNDANT METHICILLIN RESISTANT STAPHYLOCOCCUS AUREUS   Report Status 02/19/2017 FINAL  Final   Organism ID, Bacteria METHICILLIN RESISTANT STAPHYLOCOCCUS AUREUS  Final      Susceptibility   Methicillin resistant staphylococcus aureus - MIC*    CIPROFLOXACIN >=8 RESISTANT Resistant     ERYTHROMYCIN >=8 RESISTANT Resistant     GENTAMICIN <=0.5 SENSITIVE Sensitive     OXACILLIN >=4 RESISTANT Resistant     TETRACYCLINE >=16 RESISTANT Resistant     VANCOMYCIN 1 SENSITIVE Sensitive     TRIMETH/SULFA <=10 SENSITIVE Sensitive     CLINDAMYCIN <=0.25 SENSITIVE Sensitive     RIFAMPIN <=0.5 SENSITIVE Sensitive     Inducible Clindamycin NEGATIVE Sensitive     * ABUNDANT METHICILLIN RESISTANT STAPHYLOCOCCUS AUREUS    Coagulation Studies: No results for input(s): LABPROT, INR in the last 72 hours.  Urinalysis: No results for input(s): COLORURINE, LABSPEC, PHURINE, GLUCOSEU, HGBUR, BILIRUBINUR, KETONESUR, PROTEINUR, UROBILINOGEN, NITRITE, LEUKOCYTESUR in the last 72 hours.  Invalid input(s): APPERANCEUR    Imaging: Dg Chest Port 1 View  Result Date:  02/19/2017 CLINICAL DATA:  Follow-up pulmonary edema EXAM: PORTABLE CHEST 1 VIEW COMPARISON:  02/17/2017 FINDINGS: Cardiac shadow is stable. Tracheostomy tube is noted in satisfactory position. Tunneled central venous line is noted and stable. Chronic fibrotic changes are again identified. Persistent lower lobe changes are noted left greater than right similar to that seen on recent CT examination. No new focal abnormality is noted. IMPRESSION: No significant change from the recent CT. Electronically Signed  By: Alcide Clever M.D.   On: 02/19/2017 14:49     Medications:       Assessment/ Plan:  83 y.o. African-American  male with  medical problems of COPD, atrial fibrillation, thyroid storm, chronic interstitial lung disease, GERD, anemia, stroke, who was admitted to [facility] on 01/22/2017 for management of acute respiratory failure. Patient was transferred from outside hospital for weaning from ventilator. His hospital stay was complicated by atrial fibrillation, thyroid storm, altered mental status requiring intubation, PEG placement, GI bleed with endoscopy revealing gastritis, aspiration pneumonia, blood transfusion   1. Acute renal failure, Cause of acute renal failure is likely multifactorial including recent sepsis - Urine output has improved significantly after stopping diuretics and giving IV fluid supplementation 1800 cc last 24 hours. Serum creatinine and BUN are steadily improving although patient remains azotemic electrolytes and volume status are acceptable. No acute indication for dialysis at present. Patient's daughter has expressed desire for aggressive care.   2. Sepsis, lactic acidosis, gram-positive bacteremia - Treatment as per primary team with broad-spectrum antibiotics - Currently requiring pressors  3. Generalized debility and Malnutrition - Patient is cachectic - Nutritional status is poor -He has a 6 cm sacrococcygeal decubitus  4. Acute respiratory failure -  Requiring ventilator support - CT chest suggests pneumonia  5. Hypernatremia - Currently getting 1/2 NS - monitor Na closely    LOS: 0 [facility] 8/17/20184:44 PM  [facility] [place], [place] [phone]

## 2017-02-21 LAB — CBC
HCT: 24.8 % — ABNORMAL LOW (ref 39.0–52.0)
Hemoglobin: 7.7 g/dL — ABNORMAL LOW (ref 13.0–17.0)
MCH: 26.9 pg (ref 26.0–34.0)
MCHC: 31 g/dL (ref 30.0–36.0)
MCV: 86.7 fL (ref 78.0–100.0)
PLATELETS: 220 10*3/uL (ref 150–400)
RBC: 2.86 MIL/uL — ABNORMAL LOW (ref 4.22–5.81)
RDW: 18.9 % — AB (ref 11.5–15.5)
WBC: 19.1 10*3/uL — AB (ref 4.0–10.5)

## 2017-02-21 LAB — BASIC METABOLIC PANEL
ANION GAP: 12 (ref 5–15)
BUN: 146 mg/dL — ABNORMAL HIGH (ref 6–20)
CALCIUM: 7.7 mg/dL — AB (ref 8.9–10.3)
CO2: 26 mmol/L (ref 22–32)
Chloride: 98 mmol/L — ABNORMAL LOW (ref 101–111)
Creatinine, Ser: 2.37 mg/dL — ABNORMAL HIGH (ref 0.61–1.24)
GFR calc Af Amer: 28 mL/min — ABNORMAL LOW (ref >60)
GFR, EST NON AFRICAN AMERICAN: 24 mL/min — AB (ref >60)
GLUCOSE: 185 mg/dL — AB (ref 65–99)
Potassium: 4.4 mmol/L (ref 3.5–5.1)
Sodium: 136 mmol/L (ref 135–145)

## 2017-02-23 LAB — BASIC METABOLIC PANEL
Anion gap: 9 (ref 5–15)
BUN: 127 mg/dL — AB (ref 6–20)
CO2: 31 mmol/L (ref 22–32)
Calcium: 7.7 mg/dL — ABNORMAL LOW (ref 8.9–10.3)
Chloride: 102 mmol/L (ref 101–111)
Creatinine, Ser: 2 mg/dL — ABNORMAL HIGH (ref 0.61–1.24)
GFR calc Af Amer: 34 mL/min — ABNORMAL LOW (ref >60)
GFR, EST NON AFRICAN AMERICAN: 29 mL/min — AB (ref >60)
Glucose, Bld: 164 mg/dL — ABNORMAL HIGH (ref 65–99)
POTASSIUM: 4.6 mmol/L (ref 3.5–5.1)
SODIUM: 142 mmol/L (ref 135–145)

## 2017-02-23 LAB — PREPARE RBC (CROSSMATCH)

## 2017-02-23 LAB — CBC
HCT: 21 % — ABNORMAL LOW (ref 39.0–52.0)
Hemoglobin: 6.4 g/dL — CL (ref 13.0–17.0)
MCH: 26.6 pg (ref 26.0–34.0)
MCHC: 30.5 g/dL (ref 30.0–36.0)
MCV: 87.1 fL (ref 78.0–100.0)
PLATELETS: 321 10*3/uL (ref 150–400)
RBC: 2.41 MIL/uL — AB (ref 4.22–5.81)
RDW: 18.8 % — ABNORMAL HIGH (ref 11.5–15.5)
WBC: 14.9 10*3/uL — ABNORMAL HIGH (ref 4.0–10.5)

## 2017-02-23 LAB — VANCOMYCIN, TROUGH: VANCOMYCIN TR: 7 ug/mL — AB (ref 15–20)

## 2017-02-23 NOTE — Progress Notes (Signed)
[facility] [place], [place] 02/23/17  Subjective:  Patient seen at bedside. Renal function BUN 127, Cr 2.00.     Objective:  Vital signs in last 24 hours:  T: 99.5 P 109 R 35 BP: 120/62  Physical Exam: General: Critically ill-appearing  HEENT Sunken eyes, temporal wasting   Neck Tracheostomy in place   Pulm/lungs Ventilator assisted scattered rhonchi  CVS/Heart Tachycardic, irregular   Abdomen:  Soft NTND BS present  Extremities: No significant edema   Neurologic: Lethargic, not following commands   Skin: Decreased turgor    Foley in place        Basic Metabolic Panel:   Recent Labs Lab 02/17/17 0614  02/17/17 2305  02/19/17 0616 02/19/17 1757 02/20/17 0632 02/21/17 0554 02/23/17 0304  NA 155*  < >  --   < > 133* 138 137 136 142  K 6.7*  < >  --   < > 3.2* 3.8 3.4* 4.4 4.6  CL 107  < >  --   < > 92* 98* 97* 98* 102  CO2 30  < >  --   < > 31 27 26 26 31   GLUCOSE 100*  < >  --   < > 476* 198* 177* 185* 164*  BUN 173*  < >  --   < > 162* 176* 164* 146* 127*  CREATININE 3.18*  < >  --   < > 3.27* 3.23* 2.99* 2.37* 2.00*  CALCIUM 8.5*  < >  --   < > 7.3* 7.7* 7.6* 7.7* 7.7*  MG 4.0*  --  3.8*  --   --   --   --   --   --   PHOS  --   --   --   --   --   --  5.5*  --   --   < > = values in this interval not displayed.   CBC:  Recent Labs Lab 02/18/17 0659 02/19/17 0616 02/20/17 0632 02/21/17 0554 02/23/17 0304  WBC 33.5* 23.8* 22.5* 19.1* 14.9*  HGB 7.8* 7.8* 7.2* 7.7* 6.4*  HCT 26.4* 25.3* 23.4* 24.8* 21.0*  MCV 91.3 86.9 87.0 86.7 87.1  PLT 195 190 192 220 321      Lab Results  Component Value Date   HEPBSAG Negative 02/19/2017   HEPBSAB Non Reactive 02/19/2017      Microbiology:  Recent Results (from the past 240 hour(s))  Culture, Urine     Status: None   Collection Time: 02/17/17  8:30 AM  Result Value Ref Range Status   Specimen Description URINE, RANDOM  Final   Special Requests NONE  Final   Culture NO GROWTH   Final   Report Status 02/18/2017 FINAL  Final  Culture, blood (routine x 2)     Status: Abnormal   Collection Time: 02/17/17  9:36 AM  Result Value Ref Range Status   Specimen Description BLOOD LEFT HAND  Final   Special Requests IN PEDIATRIC BOTTLE Blood Culture adequate volume  Final   Culture  Setup Time   Final    GRAM POSITIVE COCCI IN CLUSTERS IN PEDIATRIC BOTTLE CRITICAL RESULT CALLED TO, READ BACK BY AND VERIFIED WITH: R.TURGOTT, RN 02/18/17 0535 L.CHAMPION    Culture (A)  Final    STAPHYLOCOCCUS SPECIES (COAGULASE NEGATIVE) THE SIGNIFICANCE OF ISOLATING THIS ORGANISM FROM A SINGLE SET OF BLOOD CULTURES WHEN MULTIPLE SETS ARE DRAWN IS UNCERTAIN. PLEASE NOTIFY THE MICROBIOLOGY DEPARTMENT WITHIN ONE WEEK IF SPECIATION AND  SENSITIVITIES ARE REQUIRED.    Report Status 02/19/2017 FINAL  Final  Culture, blood (routine x 2)     Status: Abnormal   Collection Time: 02/17/17  9:43 AM  Result Value Ref Range Status   Specimen Description BLOOD RIGHT HAND  Final   Special Requests IN PEDIATRIC BOTTLE Blood Culture adequate volume  Final   Culture  Setup Time   Final    GRAM POSITIVE COCCI IN CLUSTERS IN PEDIATRIC BOTTLE CRITICAL VALUE NOTED.  VALUE IS CONSISTENT WITH PREVIOUSLY REPORTED AND CALLED VALUE.    Culture METHICILLIN RESISTANT STAPHYLOCOCCUS AUREUS (A)  Final   Report Status 02/20/2017 FINAL  Final   Organism ID, Bacteria METHICILLIN RESISTANT STAPHYLOCOCCUS AUREUS  Final      Susceptibility   Methicillin resistant staphylococcus aureus - MIC*    CIPROFLOXACIN >=8 RESISTANT Resistant     ERYTHROMYCIN >=8 RESISTANT Resistant     GENTAMICIN <=0.5 SENSITIVE Sensitive     OXACILLIN >=4 RESISTANT Resistant     TETRACYCLINE >=16 RESISTANT Resistant     VANCOMYCIN <=0.5 SENSITIVE Sensitive     TRIMETH/SULFA <=10 SENSITIVE Sensitive     CLINDAMYCIN RESISTANT Resistant     RIFAMPIN <=0.5 SENSITIVE Sensitive     Inducible Clindamycin POSITIVE Resistant     * METHICILLIN RESISTANT  STAPHYLOCOCCUS AUREUS  Culture, respiratory (NON-Expectorated)     Status: None   Collection Time: 02/17/17 11:11 AM  Result Value Ref Range Status   Specimen Description TRACHEAL ASPIRATE  Final   Special Requests NONE  Final   Gram Stain   Final    ABUNDANT WBC PRESENT, PREDOMINANTLY PMN ABUNDANT GRAM POSITIVE COCCI MODERATE GRAM POSITIVE RODS FEW GRAM NEGATIVE RODS    Culture   Final    ABUNDANT METHICILLIN RESISTANT STAPHYLOCOCCUS AUREUS   Report Status 02/19/2017 FINAL  Final   Organism ID, Bacteria METHICILLIN RESISTANT STAPHYLOCOCCUS AUREUS  Final      Susceptibility   Methicillin resistant staphylococcus aureus - MIC*    CIPROFLOXACIN >=8 RESISTANT Resistant     ERYTHROMYCIN >=8 RESISTANT Resistant     GENTAMICIN <=0.5 SENSITIVE Sensitive     OXACILLIN >=4 RESISTANT Resistant     TETRACYCLINE >=16 RESISTANT Resistant     VANCOMYCIN 1 SENSITIVE Sensitive     TRIMETH/SULFA <=10 SENSITIVE Sensitive     CLINDAMYCIN <=0.25 SENSITIVE Sensitive     RIFAMPIN <=0.5 SENSITIVE Sensitive     Inducible Clindamycin NEGATIVE Sensitive     * ABUNDANT METHICILLIN RESISTANT STAPHYLOCOCCUS AUREUS    Coagulation Studies: No results for input(s): LABPROT, INR in the last 72 hours.  Urinalysis: No results for input(s): COLORURINE, LABSPEC, PHURINE, GLUCOSEU, HGBUR, BILIRUBINUR, KETONESUR, PROTEINUR, UROBILINOGEN, NITRITE, LEUKOCYTESUR in the last 72 hours.  Invalid input(s): APPERANCEUR    Imaging: No results found.   Medications:       Assessment/ Plan:  83 y.o. African-American  male with  medical problems of COPD, atrial fibrillation, thyroid storm, chronic interstitial lung disease, GERD, anemia, stroke, who was admitted to [facility] on 01/22/2017 for management of acute respiratory failure. Patient was transferred from outside hospital for weaning from ventilator. His hospital stay was complicated by atrial fibrillation, thyroid storm, altered mental status requiring intubation,  PEG placement, GI bleed with endoscopy revealing gastritis, aspiration pneumonia, blood transfusion   1. Acute renal failure: Cause of acute renal failure is likely multifactorial including recent sepsis - Renal function does appear to be improving. BUN down to 127 with a creatinine of 2.0. Therefore no indication for dialysis  at the moment. Continue to periodically monitor renal function.  2. Sepsis, lactic acidosis, gram-positive bacteremia - Treatment as per hospitalist.  3. Generalized debility and Malnutrition - Albumin remains very low at 1.3. Continue to monitor.  4. Acute respiratory failure - Continue ventilatory support at this time.  5. Hypernatremia - Now resolved. Serum sodium down to 142.    LOS: 0 Santresa Levett 8/20/20185:36 PM  [facility] [place], [place] [phone]

## 2017-02-24 LAB — PHOSPHORUS: Phosphorus: 5.3 mg/dL — ABNORMAL HIGH (ref 2.5–4.6)

## 2017-02-24 LAB — URINALYSIS, ROUTINE W REFLEX MICROSCOPIC
Bilirubin Urine: NEGATIVE
Glucose, UA: NEGATIVE mg/dL
Hgb urine dipstick: NEGATIVE
Ketones, ur: NEGATIVE mg/dL
LEUKOCYTES UA: NEGATIVE
Nitrite: NEGATIVE
PROTEIN: NEGATIVE mg/dL
SPECIFIC GRAVITY, URINE: 1.014 (ref 1.005–1.030)
pH: 5 (ref 5.0–8.0)

## 2017-02-24 LAB — RENAL FUNCTION PANEL
ALBUMIN: 1.2 g/dL — AB (ref 3.5–5.0)
ANION GAP: 10 (ref 5–15)
BUN: 123 mg/dL — ABNORMAL HIGH (ref 6–20)
CALCIUM: 7.8 mg/dL — AB (ref 8.9–10.3)
CO2: 26 mmol/L (ref 22–32)
Chloride: 105 mmol/L (ref 101–111)
Creatinine, Ser: 1.85 mg/dL — ABNORMAL HIGH (ref 0.61–1.24)
GFR calc non Af Amer: 32 mL/min — ABNORMAL LOW (ref >60)
GFR, EST AFRICAN AMERICAN: 37 mL/min — AB (ref >60)
GLUCOSE: 132 mg/dL — AB (ref 65–99)
PHOSPHORUS: 5.2 mg/dL — AB (ref 2.5–4.6)
POTASSIUM: 4.4 mmol/L (ref 3.5–5.1)
SODIUM: 141 mmol/L (ref 135–145)

## 2017-02-24 LAB — CBC
HEMATOCRIT: 23.1 % — AB (ref 39.0–52.0)
Hemoglobin: 7.2 g/dL — ABNORMAL LOW (ref 13.0–17.0)
MCH: 27 pg (ref 26.0–34.0)
MCHC: 31.2 g/dL (ref 30.0–36.0)
MCV: 86.5 fL (ref 78.0–100.0)
Platelets: 392 10*3/uL (ref 150–400)
RBC: 2.67 MIL/uL — AB (ref 4.22–5.81)
RDW: 18 % — ABNORMAL HIGH (ref 11.5–15.5)
WBC: 13.6 10*3/uL — ABNORMAL HIGH (ref 4.0–10.5)

## 2017-02-24 LAB — MAGNESIUM: MAGNESIUM: 2.6 mg/dL — AB (ref 1.7–2.4)

## 2017-02-25 LAB — CBC
HEMATOCRIT: 21.1 % — AB (ref 39.0–52.0)
Hemoglobin: 6.6 g/dL — CL (ref 13.0–17.0)
MCH: 27.6 pg (ref 26.0–34.0)
MCHC: 31.3 g/dL (ref 30.0–36.0)
MCV: 88.3 fL (ref 78.0–100.0)
PLATELETS: 465 10*3/uL — AB (ref 150–400)
RBC: 2.39 MIL/uL — ABNORMAL LOW (ref 4.22–5.81)
RDW: 18.6 % — AB (ref 11.5–15.5)
WBC: 12.9 10*3/uL — AB (ref 4.0–10.5)

## 2017-02-25 LAB — URINE CULTURE: CULTURE: NO GROWTH

## 2017-02-25 LAB — PREPARE RBC (CROSSMATCH)

## 2017-02-25 NOTE — Progress Notes (Signed)
[facility], [place] 02/25/17  Subjective:  Hemoglobin down to 6.6 today. Patient was administered PRBC infusion. Renal function was actually a bit better yesterday with a creatinine of 1.85.   Objective:  Vital signs in last 24 hours:  MCV 97.1 pulse 95 respirations 25 blood pressure 121/79  Physical Exam: General: Critically ill-appearing  HEENT Sunken eyes, temporal wasting   Neck Tracheostomy in place   Pulm/lungs Scattered rhonchi   CVS/Heart Irregular, no rubs   Abdomen:  Soft NTND BS present  Extremities: No significant edema   Neurologic: Lethargic, not following commands   Skin: Decreased turgor    Foley in place        Basic Metabolic Panel:   Recent Labs Lab 02/19/17 1757 02/20/17 0632 02/21/17 0554 02/23/17 0304 02/24/17 0537  NA 138 137 136 142 141  K 3.8 3.4* 4.4 4.6 4.4  CL 98* 97* 98* 102 105  CO2 27 26 26 31 26   GLUCOSE 198* 177* 185* 164* 132*  BUN 176* 164* 146* 127* 123*  CREATININE 3.23* 2.99* 2.37* 2.00* 1.85*  CALCIUM 7.7* 7.6* 7.7* 7.7* 7.8*  MG  --   --   --   --  2.6*  PHOS  --  5.5*  --   --  5.2*  5.3*     CBC:  Recent Labs Lab 02/20/17 9562 02/21/17 0554 02/23/17 0304 02/24/17 0537 02/25/17 0415  WBC 22.5* 19.1* 14.9* 13.6* 12.9*  HGB 7.2* 7.7* 6.4* 7.2* 6.6*  HCT 23.4* 24.8* 21.0* 23.1* 21.1*  MCV 87.0 86.7 87.1 86.5 88.3  PLT 192 220 321 392 465*      Lab Results  Component Value Date   HEPBSAG Negative 02/19/2017   HEPBSAB Non Reactive 02/19/2017      Microbiology:  Recent Results (from the past 240 hour(s))  Culture, Urine     Status: None   Collection Time: 02/17/17  8:30 AM  Result Value Ref Range Status   Specimen Description URINE, RANDOM  Final   Special Requests NONE  Final   Culture NO GROWTH  Final   Report Status 02/18/2017 FINAL  Final  Culture, blood (routine x 2)     Status: Abnormal   Collection Time: 02/17/17  9:36 AM  Result Value Ref Range Status    Specimen Description BLOOD LEFT HAND  Final   Special Requests IN PEDIATRIC BOTTLE Blood Culture adequate volume  Final   Culture  Setup Time   Final    GRAM POSITIVE COCCI IN CLUSTERS IN PEDIATRIC BOTTLE CRITICAL RESULT CALLED TO, READ BACK BY AND VERIFIED WITH: R.TURGOTT, RN 02/18/17 0535 L.CHAMPION    Culture (A)  Final    STAPHYLOCOCCUS SPECIES (COAGULASE NEGATIVE) THE SIGNIFICANCE OF ISOLATING THIS ORGANISM FROM A SINGLE SET OF BLOOD CULTURES WHEN MULTIPLE SETS ARE DRAWN IS UNCERTAIN. PLEASE NOTIFY THE MICROBIOLOGY DEPARTMENT WITHIN ONE WEEK IF SPECIATION AND SENSITIVITIES ARE REQUIRED.    Report Status 02/19/2017 FINAL  Final  Culture, blood (routine x 2)     Status: Abnormal   Collection Time: 02/17/17  9:43 AM  Result Value Ref Range Status   Specimen Description BLOOD RIGHT HAND  Final   Special Requests IN PEDIATRIC BOTTLE Blood Culture adequate volume  Final   Culture  Setup Time   Final    GRAM POSITIVE COCCI IN CLUSTERS IN PEDIATRIC BOTTLE CRITICAL VALUE NOTED.  VALUE IS CONSISTENT WITH PREVIOUSLY REPORTED AND CALLED VALUE.    Culture METHICILLIN RESISTANT STAPHYLOCOCCUS AUREUS (A)  Final  Report Status 02/20/2017 FINAL  Final   Organism ID, Bacteria METHICILLIN RESISTANT STAPHYLOCOCCUS AUREUS  Final      Susceptibility   Methicillin resistant staphylococcus aureus - MIC*    CIPROFLOXACIN >=8 RESISTANT Resistant     ERYTHROMYCIN >=8 RESISTANT Resistant     GENTAMICIN <=0.5 SENSITIVE Sensitive     OXACILLIN >=4 RESISTANT Resistant     TETRACYCLINE >=16 RESISTANT Resistant     VANCOMYCIN <=0.5 SENSITIVE Sensitive     TRIMETH/SULFA <=10 SENSITIVE Sensitive     CLINDAMYCIN RESISTANT Resistant     RIFAMPIN <=0.5 SENSITIVE Sensitive     Inducible Clindamycin POSITIVE Resistant     * METHICILLIN RESISTANT STAPHYLOCOCCUS AUREUS  Culture, respiratory (NON-Expectorated)     Status: None   Collection Time: 02/17/17 11:11 AM  Result Value Ref Range Status   Specimen  Description TRACHEAL ASPIRATE  Final   Special Requests NONE  Final   Gram Stain   Final    ABUNDANT WBC PRESENT, PREDOMINANTLY PMN ABUNDANT GRAM POSITIVE COCCI MODERATE GRAM POSITIVE RODS FEW GRAM NEGATIVE RODS    Culture   Final    ABUNDANT METHICILLIN RESISTANT STAPHYLOCOCCUS AUREUS   Report Status 02/19/2017 FINAL  Final   Organism ID, Bacteria METHICILLIN RESISTANT STAPHYLOCOCCUS AUREUS  Final      Susceptibility   Methicillin resistant staphylococcus aureus - MIC*    CIPROFLOXACIN >=8 RESISTANT Resistant     ERYTHROMYCIN >=8 RESISTANT Resistant     GENTAMICIN <=0.5 SENSITIVE Sensitive     OXACILLIN >=4 RESISTANT Resistant     TETRACYCLINE >=16 RESISTANT Resistant     VANCOMYCIN 1 SENSITIVE Sensitive     TRIMETH/SULFA <=10 SENSITIVE Sensitive     CLINDAMYCIN <=0.25 SENSITIVE Sensitive     RIFAMPIN <=0.5 SENSITIVE Sensitive     Inducible Clindamycin NEGATIVE Sensitive     * ABUNDANT METHICILLIN RESISTANT STAPHYLOCOCCUS AUREUS  Culture, Urine     Status: None   Collection Time: 02/24/17  1:14 PM  Result Value Ref Range Status   Specimen Description URINE, RANDOM  Final   Special Requests NONE  Final   Culture NO GROWTH  Final   Report Status 02/25/2017 FINAL  Final  Culture, respiratory (NON-Expectorated)     Status: None (Preliminary result)   Collection Time: 02/25/17  8:27 AM  Result Value Ref Range Status   Specimen Description TRACHEAL ASPIRATE  Final   Special Requests NONE  Final   Gram Stain   Final    RARE WBC PRESENT, PREDOMINANTLY PMN RARE GRAM POSITIVE COCCI    Culture PENDING  Incomplete   Report Status PENDING  Incomplete    Coagulation Studies: No results for input(s): LABPROT, INR in the last 72 hours.  Urinalysis:  Recent Labs  02/24/17 1315  COLORURINE YELLOW  LABSPEC 1.014  PHURINE 5.0  GLUCOSEU NEGATIVE  HGBUR NEGATIVE  BILIRUBINUR NEGATIVE  KETONESUR NEGATIVE  PROTEINUR NEGATIVE  NITRITE NEGATIVE  LEUKOCYTESUR NEGATIVE       Imaging: No results found.   Medications:       Assessment/ Plan:  83 y.o. African-American  male with  medical problems of COPD, atrial fibrillation, thyroid storm, chronic interstitial lung disease, GERD, anemia, stroke, who was admitted to [facility] on 01/22/2017 for management of acute respiratory failure. Patient was transferred from outside hospital for weaning from ventilator. His hospital stay was complicated by atrial fibrillation, thyroid storm, altered mental status requiring intubation, PEG placement, GI bleed with endoscopy revealing gastritis, aspiration pneumonia, blood transfusion   1. Acute  renal failure: Cause of acute renal failure is likely multifactorial including recent sepsis - Creatinine down to 1.85 however patient still has significant azotemia. Patient still making urine however. No indication for dialysis and the patient will make a very poor candidate for long-term dialysis.  2. Sepsis, lactic acidosis, gram-positive bacteremia - Treatment as per hospitalist.  3. Generalized debility and Malnutrition - Albumin down to 1.2. Extremely malnourished. This is a very poor prognostic sign.  4. Acute respiratory failure - Ventilatory support being continued.  5. Hypernatremia - Now resolved. Serum sodium down to 141.    LOS: 0 Mahaley Schwering 8/22/20184:39 PM  [facility] [place], [place] [phone]

## 2017-02-26 LAB — TYPE AND SCREEN
ABO/RH(D): A POS
Antibody Screen: POSITIVE
DAT, IgG: POSITIVE
Donor AG Type: NEGATIVE
Donor AG Type: NEGATIVE
Donor AG Type: NEGATIVE
Unit division: 0
Unit division: 0
Unit division: 0

## 2017-02-26 LAB — BASIC METABOLIC PANEL
Anion gap: 9 (ref 5–15)
BUN: 100 mg/dL — AB (ref 6–20)
CHLORIDE: 109 mmol/L (ref 101–111)
CO2: 29 mmol/L (ref 22–32)
CREATININE: 1.33 mg/dL — AB (ref 0.61–1.24)
Calcium: 8.5 mg/dL — ABNORMAL LOW (ref 8.9–10.3)
GFR calc Af Amer: 55 mL/min — ABNORMAL LOW (ref >60)
GFR, EST NON AFRICAN AMERICAN: 48 mL/min — AB (ref >60)
Glucose, Bld: 127 mg/dL — ABNORMAL HIGH (ref 65–99)
Potassium: 4.3 mmol/L (ref 3.5–5.1)
SODIUM: 147 mmol/L — AB (ref 135–145)

## 2017-02-26 LAB — BPAM RBC
Blood Product Expiration Date: 201808312359
Blood Product Expiration Date: 201809052359
Blood Product Expiration Date: 201809052359
ISSUE DATE / TIME: 201808201136
ISSUE DATE / TIME: 201808221022
ISSUE DATE / TIME: 201808221711
Unit Type and Rh: 5100
Unit Type and Rh: 5100
Unit Type and Rh: 600

## 2017-02-26 LAB — CBC WITH DIFFERENTIAL/PLATELET
Basophils Absolute: 0 10*3/uL (ref 0.0–0.1)
Basophils Relative: 0 %
EOS ABS: 0.2 10*3/uL (ref 0.0–0.7)
EOS PCT: 2 %
HCT: 31.3 % — ABNORMAL LOW (ref 39.0–52.0)
HEMOGLOBIN: 9.8 g/dL — AB (ref 13.0–17.0)
LYMPHS ABS: 1.3 10*3/uL (ref 0.7–4.0)
Lymphocytes Relative: 11 %
MCH: 27.1 pg (ref 26.0–34.0)
MCHC: 31.3 g/dL (ref 30.0–36.0)
MCV: 86.7 fL (ref 78.0–100.0)
MONO ABS: 1 10*3/uL (ref 0.1–1.0)
Monocytes Relative: 8 %
Neutro Abs: 9.5 10*3/uL — ABNORMAL HIGH (ref 1.7–7.7)
Neutrophils Relative %: 79 %
Platelets: 494 10*3/uL — ABNORMAL HIGH (ref 150–400)
RBC: 3.61 MIL/uL — AB (ref 4.22–5.81)
RDW: 18.9 % — ABNORMAL HIGH (ref 11.5–15.5)
WBC: 12 10*3/uL — AB (ref 4.0–10.5)

## 2017-02-26 LAB — PHOSPHORUS: Phosphorus: 4.1 mg/dL (ref 2.5–4.6)

## 2017-02-26 LAB — VANCOMYCIN, TROUGH: Vancomycin Tr: 27 ug/mL (ref 15–20)

## 2017-02-26 LAB — MAGNESIUM: Magnesium: 2.6 mg/dL — ABNORMAL HIGH (ref 1.7–2.4)

## 2017-02-27 ENCOUNTER — Other Ambulatory Visit (HOSPITAL_COMMUNITY): Payer: Self-pay

## 2017-02-27 LAB — CBC
HCT: 30.4 % — ABNORMAL LOW (ref 39.0–52.0)
Hemoglobin: 9.4 g/dL — ABNORMAL LOW (ref 13.0–17.0)
MCH: 27.7 pg (ref 26.0–34.0)
MCHC: 30.9 g/dL (ref 30.0–36.0)
MCV: 89.7 fL (ref 78.0–100.0)
PLATELETS: 625 10*3/uL — AB (ref 150–400)
RBC: 3.39 MIL/uL — AB (ref 4.22–5.81)
RDW: 20 % — ABNORMAL HIGH (ref 11.5–15.5)
WBC: 14 10*3/uL — AB (ref 4.0–10.5)

## 2017-02-27 LAB — T4, FREE: FREE T4: 0.94 ng/dL (ref 0.61–1.12)

## 2017-02-27 LAB — BASIC METABOLIC PANEL
Anion gap: 9 (ref 5–15)
BUN: 84 mg/dL — ABNORMAL HIGH (ref 6–20)
CALCIUM: 8.5 mg/dL — AB (ref 8.9–10.3)
CO2: 28 mmol/L (ref 22–32)
Chloride: 113 mmol/L — ABNORMAL HIGH (ref 101–111)
Creatinine, Ser: 1.17 mg/dL (ref 0.61–1.24)
GFR, EST NON AFRICAN AMERICAN: 56 mL/min — AB (ref >60)
Glucose, Bld: 109 mg/dL — ABNORMAL HIGH (ref 65–99)
POTASSIUM: 4.4 mmol/L (ref 3.5–5.1)
SODIUM: 150 mmol/L — AB (ref 135–145)

## 2017-02-27 LAB — CULTURE, RESPIRATORY W GRAM STAIN

## 2017-02-27 LAB — VANCOMYCIN, TROUGH: VANCOMYCIN TR: 22 ug/mL — AB (ref 15–20)

## 2017-02-27 LAB — CULTURE, RESPIRATORY

## 2017-02-27 LAB — TSH: TSH: 1.785 u[IU]/mL (ref 0.350–4.500)

## 2017-02-27 NOTE — Progress Notes (Signed)
[facility], [place] 02/27/17  Subjective:  No new renal function testing today. However yesterday creatinine was down to 1.3. Overall however patient remains quite ill.   Objective:  Vital signs in last 24 hours:  Temperature 97.7 pulse 106 respirations 24 blood pressure 1:15/67  Physical Exam: General: Critically ill-appearing  HEENT Sunken eyes, temporal wasting   Neck Tracheostomy in place   Pulm/lungs Scattered rhonchi, Normal effort   CVS/Heart Irregular, no rubs   Abdomen:  Soft NTND BS present  Extremities: No significant edema   Neurologic: Lethargic, not following commands   Skin: Decreased turgor    Foley in place        Basic Metabolic Panel:   Recent Labs Lab 02/21/17 0554 02/23/17 0304 02/24/17 0537 02/26/17 0532  NA 136 142 141 147*  K 4.4 4.6 4.4 4.3  CL 98* 102 105 109  CO2 26 31 26 29   GLUCOSE 185* 164* 132* 127*  BUN 146* 127* 123* 100*  CREATININE 2.37* 2.00* 1.85* 1.33*  CALCIUM 7.7* 7.7* 7.8* 8.5*  MG  --   --  2.6* 2.6*  PHOS  --   --  5.2*  5.3* 4.1     CBC:  Recent Labs Lab 02/21/17 0554 02/23/17 0304 02/24/17 0537 02/25/17 0415 02/26/17 0532  WBC 19.1* 14.9* 13.6* 12.9* 12.0*  NEUTROABS  --   --   --   --  9.5*  HGB 7.7* 6.4* 7.2* 6.6* 9.8*  HCT 24.8* 21.0* 23.1* 21.1* 31.3*  MCV 86.7 87.1 86.5 88.3 86.7  PLT 220 321 392 465* 494*      Lab Results  Component Value Date   HEPBSAG Negative 02/19/2017   HEPBSAB Non Reactive 02/19/2017      Microbiology:  Recent Results (from the past 240 hour(s))  Culture, blood (routine x 2)     Status: Abnormal   Collection Time: 02/17/17  9:36 AM  Result Value Ref Range Status   Specimen Description BLOOD LEFT HAND  Final   Special Requests IN PEDIATRIC BOTTLE Blood Culture adequate volume  Final   Culture  Setup Time   Final    GRAM POSITIVE COCCI IN CLUSTERS IN PEDIATRIC BOTTLE CRITICAL RESULT CALLED TO, READ BACK BY AND VERIFIED WITH:  R.TURGOTT, RN 02/18/17 0535 L.CHAMPION    Culture (A)  Final    STAPHYLOCOCCUS SPECIES (COAGULASE NEGATIVE) THE SIGNIFICANCE OF ISOLATING THIS ORGANISM FROM A SINGLE SET OF BLOOD CULTURES WHEN MULTIPLE SETS ARE DRAWN IS UNCERTAIN. PLEASE NOTIFY THE MICROBIOLOGY DEPARTMENT WITHIN ONE WEEK IF SPECIATION AND SENSITIVITIES ARE REQUIRED.    Report Status 02/19/2017 FINAL  Final  Culture, blood (routine x 2)     Status: Abnormal   Collection Time: 02/17/17  9:43 AM  Result Value Ref Range Status   Specimen Description BLOOD RIGHT HAND  Final   Special Requests IN PEDIATRIC BOTTLE Blood Culture adequate volume  Final   Culture  Setup Time   Final    GRAM POSITIVE COCCI IN CLUSTERS IN PEDIATRIC BOTTLE CRITICAL VALUE NOTED.  VALUE IS CONSISTENT WITH PREVIOUSLY REPORTED AND CALLED VALUE.    Culture METHICILLIN RESISTANT STAPHYLOCOCCUS AUREUS (A)  Final   Report Status 02/20/2017 FINAL  Final   Organism ID, Bacteria METHICILLIN RESISTANT STAPHYLOCOCCUS AUREUS  Final      Susceptibility   Methicillin resistant staphylococcus aureus - MIC*    CIPROFLOXACIN >=8 RESISTANT Resistant     ERYTHROMYCIN >=8 RESISTANT Resistant     GENTAMICIN <=0.5 SENSITIVE Sensitive  OXACILLIN >=4 RESISTANT Resistant     TETRACYCLINE >=16 RESISTANT Resistant     VANCOMYCIN <=0.5 SENSITIVE Sensitive     TRIMETH/SULFA <=10 SENSITIVE Sensitive     CLINDAMYCIN RESISTANT Resistant     RIFAMPIN <=0.5 SENSITIVE Sensitive     Inducible Clindamycin POSITIVE Resistant     * METHICILLIN RESISTANT STAPHYLOCOCCUS AUREUS  Culture, respiratory (NON-Expectorated)     Status: None   Collection Time: 02/17/17 11:11 AM  Result Value Ref Range Status   Specimen Description TRACHEAL ASPIRATE  Final   Special Requests NONE  Final   Gram Stain   Final    ABUNDANT WBC PRESENT, PREDOMINANTLY PMN ABUNDANT GRAM POSITIVE COCCI MODERATE GRAM POSITIVE RODS FEW GRAM NEGATIVE RODS    Culture   Final    ABUNDANT METHICILLIN RESISTANT  STAPHYLOCOCCUS AUREUS   Report Status 02/19/2017 FINAL  Final   Organism ID, Bacteria METHICILLIN RESISTANT STAPHYLOCOCCUS AUREUS  Final      Susceptibility   Methicillin resistant staphylococcus aureus - MIC*    CIPROFLOXACIN >=8 RESISTANT Resistant     ERYTHROMYCIN >=8 RESISTANT Resistant     GENTAMICIN <=0.5 SENSITIVE Sensitive     OXACILLIN >=4 RESISTANT Resistant     TETRACYCLINE >=16 RESISTANT Resistant     VANCOMYCIN 1 SENSITIVE Sensitive     TRIMETH/SULFA <=10 SENSITIVE Sensitive     CLINDAMYCIN <=0.25 SENSITIVE Sensitive     RIFAMPIN <=0.5 SENSITIVE Sensitive     Inducible Clindamycin NEGATIVE Sensitive     * ABUNDANT METHICILLIN RESISTANT STAPHYLOCOCCUS AUREUS  Culture, Urine     Status: None   Collection Time: 02/24/17  1:14 PM  Result Value Ref Range Status   Specimen Description URINE, RANDOM  Final   Special Requests NONE  Final   Culture NO GROWTH  Final   Report Status 02/25/2017 FINAL  Final  Culture, respiratory (NON-Expectorated)     Status: None (Preliminary result)   Collection Time: 02/25/17  8:27 AM  Result Value Ref Range Status   Specimen Description TRACHEAL ASPIRATE  Final   Special Requests NONE  Final   Gram Stain   Final    RARE WBC PRESENT, PREDOMINANTLY PMN RARE GRAM POSITIVE COCCI    Culture   Final    FEW STAPHYLOCOCCUS AUREUS SUSCEPTIBILITIES TO FOLLOW    Report Status PENDING  Incomplete  Culture, blood (routine x 2)     Status: None (Preliminary result)   Collection Time: 02/25/17  8:39 AM  Result Value Ref Range Status   Specimen Description BLOOD RIGHT HAND  Final   Special Requests IN PEDIATRIC BOTTLE Blood Culture adequate volume  Final   Culture  Setup Time   Final    GRAM POSITIVE COCCI IN CLUSTERS Organism ID to follow IN PEDIATRIC BOTTLE CRITICAL RESULT CALLED TO, READ BACK BY AND VERIFIED WITH: A PATTERSON 02/26/17 @ 0826 M VESTAL    Culture GRAM POSITIVE COCCI  Final   Report Status PENDING  Incomplete  Culture, blood  (routine x 2)     Status: None (Preliminary result)   Collection Time: 02/25/17  8:39 AM  Result Value Ref Range Status   Specimen Description BLOOD RIGHT HAND  Final   Special Requests IN PEDIATRIC BOTTLE Blood Culture adequate volume  Final   Culture NO GROWTH 1 DAY  Final   Report Status PENDING  Incomplete    Coagulation Studies: No results for input(s): LABPROT, INR in the last 72 hours.  Urinalysis:  Recent Labs  02/24/17 1315  COLORURINE  YELLOW  LABSPEC 1.014  PHURINE 5.0  GLUCOSEU NEGATIVE  HGBUR NEGATIVE  BILIRUBINUR NEGATIVE  KETONESUR NEGATIVE  PROTEINUR NEGATIVE  NITRITE NEGATIVE  LEUKOCYTESUR NEGATIVE      Imaging: Dg Chest Port 1 View  Result Date: 02/27/2017 CLINICAL DATA:  Tracheostomy tube. EXAM: PORTABLE CHEST 1 VIEW COMPARISON:  02/19/2017.  02/17/2017.  CT 02/17/2017. FINDINGS: Tracheostomy tube and right IJ line stable position. Heart size stable. Persistent bilateral pulmonary infiltrates, particularly prominent on the left no change from prior CT. Interim increase in bilateral pleural effusions, particular on the left. No pneumothorax. IMPRESSION: 1. Lines and tubes stable position. 2. Persistent bilateral pulmonary infiltrates, particularly prominent on the left. No change from prior CT. 2. Progressive bilateral pleural effusions, particularly on the left. Electronically Signed   By: Maisie Fus  Register   On: 02/27/2017 07:28     Medications:       Assessment/ Plan:  83 y.o. African-American  male with  medical problems of COPD, atrial fibrillation, thyroid storm, chronic interstitial lung disease, GERD, anemia, stroke, who was admitted to [facility] on 01/22/2017 for management of acute respiratory failure. Patient was transferred from outside hospital for weaning from ventilator. His hospital stay was complicated by atrial fibrillation, thyroid storm, altered mental status requiring intubation, PEG placement, GI bleed with endoscopy revealing gastritis,  aspiration pneumonia, blood transfusion   1. Acute renal failure: Cause of acute renal failure is likely multifactorial including recent sepsis - Creatinine continues to come down and is currently 1.3 with a BUN of 100. As before patient would make a very poor dialysis candidate given his overall comorbidities. Continue to monitor renal function periodically.  2. Sepsis, lactic acidosis, gram-positive bacteremia - Treatment as per hospitalist.  3. Generalized debility and Malnutrition - Albumin remains very low at 1.2. Patient is on tube feeds.  4. Acute respiratory failure - Appears to have bilateral infiltrates as well as pleural effusions. Patient remains on the ventilator. Overall poor prognosis as above.  5. Hypernatremia - Sodium up at higher at 147 today. Continue to monitor and adjust free water as necessary.    LOS: 0 Mady Haagensen 8/24/20188:36 AM  [address] [place], [place] [phone]

## 2017-02-28 LAB — RENAL FUNCTION PANEL
ALBUMIN: 1.6 g/dL — AB (ref 3.5–5.0)
Anion gap: 8 (ref 5–15)
BUN: 75 mg/dL — AB (ref 6–20)
CO2: 29 mmol/L (ref 22–32)
CREATININE: 1.11 mg/dL (ref 0.61–1.24)
Calcium: 8.7 mg/dL — ABNORMAL LOW (ref 8.9–10.3)
Chloride: 112 mmol/L — ABNORMAL HIGH (ref 101–111)
GFR calc Af Amer: >60 mL/min (ref >60)
GFR, EST NON AFRICAN AMERICAN: 59 mL/min — AB (ref >60)
GLUCOSE: 107 mg/dL — AB (ref 65–99)
PHOSPHORUS: 4 mg/dL (ref 2.5–4.6)
POTASSIUM: 4.5 mmol/L (ref 3.5–5.1)
Sodium: 149 mmol/L — ABNORMAL HIGH (ref 135–145)

## 2017-02-28 LAB — CULTURE, BLOOD (ROUTINE X 2): Special Requests: ADEQUATE

## 2017-02-28 LAB — MAGNESIUM: MAGNESIUM: 2.5 mg/dL — AB (ref 1.7–2.4)

## 2017-02-28 LAB — VANCOMYCIN, TROUGH: Vancomycin Tr: 17 ug/mL (ref 15–20)

## 2017-03-01 LAB — CBC WITH DIFFERENTIAL/PLATELET
BASOS ABS: 0 10*3/uL (ref 0.0–0.1)
Basophils Relative: 0 %
EOS PCT: 2 %
Eosinophils Absolute: 0.2 10*3/uL (ref 0.0–0.7)
HCT: 30.3 % — ABNORMAL LOW (ref 39.0–52.0)
Hemoglobin: 8.8 g/dL — ABNORMAL LOW (ref 13.0–17.0)
LYMPHS PCT: 9 %
Lymphs Abs: 1.2 10*3/uL (ref 0.7–4.0)
MCH: 27 pg (ref 26.0–34.0)
MCHC: 29 g/dL — ABNORMAL LOW (ref 30.0–36.0)
MCV: 92.9 fL (ref 78.0–100.0)
Monocytes Absolute: 0.6 10*3/uL (ref 0.1–1.0)
Monocytes Relative: 4 %
NEUTROS PCT: 85 %
Neutro Abs: 11.6 10*3/uL — ABNORMAL HIGH (ref 1.7–7.7)
PLATELETS: 637 10*3/uL — AB (ref 150–400)
RBC: 3.26 MIL/uL — AB (ref 4.22–5.81)
RDW: 19.7 % — ABNORMAL HIGH (ref 11.5–15.5)
WBC: 13.6 10*3/uL — AB (ref 4.0–10.5)

## 2017-03-02 ENCOUNTER — Other Ambulatory Visit (HOSPITAL_COMMUNITY): Payer: Self-pay

## 2017-03-02 LAB — CULTURE, BLOOD (ROUTINE X 2)
Culture: NO GROWTH
SPECIAL REQUESTS: ADEQUATE

## 2017-03-02 LAB — RENAL FUNCTION PANEL
ALBUMIN: 1.7 g/dL — AB (ref 3.5–5.0)
ANION GAP: 6 (ref 5–15)
BUN: 57 mg/dL — ABNORMAL HIGH (ref 6–20)
CALCIUM: 8.3 mg/dL — AB (ref 8.9–10.3)
CO2: 29 mmol/L (ref 22–32)
Chloride: 111 mmol/L (ref 101–111)
Creatinine, Ser: 0.91 mg/dL (ref 0.61–1.24)
GFR calc non Af Amer: >60 mL/min (ref >60)
GLUCOSE: 129 mg/dL — AB (ref 65–99)
PHOSPHORUS: 3.6 mg/dL (ref 2.5–4.6)
POTASSIUM: 4.2 mmol/L (ref 3.5–5.1)
Sodium: 146 mmol/L — ABNORMAL HIGH (ref 135–145)

## 2017-03-02 LAB — MAGNESIUM: Magnesium: 2.3 mg/dL (ref 1.7–2.4)

## 2017-03-03 ENCOUNTER — Other Ambulatory Visit (HOSPITAL_COMMUNITY): Payer: Self-pay

## 2017-03-03 LAB — BASIC METABOLIC PANEL
Anion gap: 7 (ref 5–15)
BUN: 48 mg/dL — AB (ref 6–20)
CHLORIDE: 108 mmol/L (ref 101–111)
CO2: 28 mmol/L (ref 22–32)
Calcium: 8.2 mg/dL — ABNORMAL LOW (ref 8.9–10.3)
Creatinine, Ser: 0.84 mg/dL (ref 0.61–1.24)
GFR calc Af Amer: >60 mL/min (ref >60)
GFR calc non Af Amer: >60 mL/min (ref >60)
Glucose, Bld: 114 mg/dL — ABNORMAL HIGH (ref 65–99)
POTASSIUM: 4.3 mmol/L (ref 3.5–5.1)
SODIUM: 143 mmol/L (ref 135–145)

## 2017-03-03 MED ORDER — LIDOCAINE HCL (PF) 1 % IJ SOLN
INTRAMUSCULAR | Status: AC
  Filled 2017-03-03: qty 30

## 2017-03-04 ENCOUNTER — Other Ambulatory Visit (HOSPITAL_COMMUNITY): Payer: Self-pay

## 2017-03-04 LAB — BODY FLUID CELL COUNT WITH DIFFERENTIAL
Eos, Fluid: 3 %
Lymphs, Fluid: 67 %
Monocyte-Macrophage-Serous Fluid: 22 % — ABNORMAL LOW (ref 50–90)
NEUTROPHIL FLUID: 8 % (ref 0–25)
WBC FLUID: 1075 uL — AB (ref 0–1000)

## 2017-03-04 LAB — GRAM STAIN

## 2017-03-04 LAB — GLUCOSE, PLEURAL OR PERITONEAL FLUID: Glucose, Fluid: 108 mg/dL

## 2017-03-04 LAB — PROTEIN, PLEURAL OR PERITONEAL FLUID

## 2017-03-04 MED ORDER — LIDOCAINE HCL (PF) 1 % IJ SOLN
INTRAMUSCULAR | Status: AC
  Filled 2017-03-04: qty 30

## 2017-03-04 NOTE — Procedures (Signed)
Ultrasound-guided diagnostic and therapeutic left thoracentesis performed yielding 1.4 liters of bloody colored fluid. No immediate complications. Follow-up chest x-ray pending.       Adrian Cobb E 3:48 PM 03/04/2017

## 2017-03-05 LAB — PH, BODY FLUID: pH, Body Fluid: 7.8

## 2017-03-07 LAB — CBC WITH DIFFERENTIAL/PLATELET
BASOS PCT: 0 %
Basophils Absolute: 0 10*3/uL (ref 0.0–0.1)
EOS ABS: 0.1 10*3/uL (ref 0.0–0.7)
EOS PCT: 1 %
HCT: 29.1 % — ABNORMAL LOW (ref 39.0–52.0)
Hemoglobin: 8.6 g/dL — ABNORMAL LOW (ref 13.0–17.0)
LYMPHS ABS: 1.2 10*3/uL (ref 0.7–4.0)
Lymphocytes Relative: 11 %
MCH: 26.5 pg (ref 26.0–34.0)
MCHC: 29.6 g/dL — AB (ref 30.0–36.0)
MCV: 89.5 fL (ref 78.0–100.0)
MONOS PCT: 6 %
Monocytes Absolute: 0.7 10*3/uL (ref 0.1–1.0)
NEUTROS PCT: 82 %
Neutro Abs: 8.9 10*3/uL — ABNORMAL HIGH (ref 1.7–7.7)
PLATELETS: 673 10*3/uL — AB (ref 150–400)
RBC: 3.25 MIL/uL — ABNORMAL LOW (ref 4.22–5.81)
RDW: 19.2 % — AB (ref 11.5–15.5)
WBC: 10.9 10*3/uL — ABNORMAL HIGH (ref 4.0–10.5)

## 2017-03-07 LAB — LACTIC ACID, PLASMA: Lactic Acid, Venous: 1.4 mmol/L (ref 0.5–1.9)

## 2017-03-08 LAB — COMPREHENSIVE METABOLIC PANEL
ALBUMIN: 1.5 g/dL — AB (ref 3.5–5.0)
ALT: 44 U/L (ref 17–63)
AST: 55 U/L — ABNORMAL HIGH (ref 15–41)
Alkaline Phosphatase: 340 U/L — ABNORMAL HIGH (ref 38–126)
Anion gap: 6 (ref 5–15)
BUN: 45 mg/dL — AB (ref 6–20)
CHLORIDE: 108 mmol/L (ref 101–111)
CO2: 27 mmol/L (ref 22–32)
CREATININE: 0.91 mg/dL (ref 0.61–1.24)
Calcium: 8 mg/dL — ABNORMAL LOW (ref 8.9–10.3)
GFR calc Af Amer: >60 mL/min (ref >60)
GFR calc non Af Amer: >60 mL/min (ref >60)
GLUCOSE: 101 mg/dL — AB (ref 65–99)
POTASSIUM: 4.4 mmol/L (ref 3.5–5.1)
SODIUM: 141 mmol/L (ref 135–145)
Total Bilirubin: 0.4 mg/dL (ref 0.3–1.2)
Total Protein: 5.7 g/dL — ABNORMAL LOW (ref 6.5–8.1)

## 2017-03-08 LAB — MAGNESIUM: MAGNESIUM: 2 mg/dL (ref 1.7–2.4)

## 2017-03-08 LAB — CBC
HCT: 28.6 % — ABNORMAL LOW (ref 39.0–52.0)
Hemoglobin: 8.5 g/dL — ABNORMAL LOW (ref 13.0–17.0)
MCH: 26.6 pg (ref 26.0–34.0)
MCHC: 29.7 g/dL — ABNORMAL LOW (ref 30.0–36.0)
MCV: 89.4 fL (ref 78.0–100.0)
PLATELETS: 672 10*3/uL — AB (ref 150–400)
RBC: 3.2 MIL/uL — AB (ref 4.22–5.81)
RDW: 19.2 % — ABNORMAL HIGH (ref 11.5–15.5)
WBC: 10.5 10*3/uL (ref 4.0–10.5)

## 2017-03-08 LAB — BASIC METABOLIC PANEL
ANION GAP: 5 (ref 5–15)
BUN: 44 mg/dL — ABNORMAL HIGH (ref 6–20)
CHLORIDE: 111 mmol/L (ref 101–111)
CO2: 28 mmol/L (ref 22–32)
Calcium: 8.1 mg/dL — ABNORMAL LOW (ref 8.9–10.3)
Creatinine, Ser: 0.91 mg/dL (ref 0.61–1.24)
GFR calc Af Amer: >60 mL/min (ref >60)
GLUCOSE: 80 mg/dL (ref 65–99)
POTASSIUM: 4.6 mmol/L (ref 3.5–5.1)
SODIUM: 144 mmol/L (ref 135–145)

## 2017-03-08 LAB — TROPONIN I: Troponin I: <0.03 ng/mL (ref <0.03)

## 2017-03-09 LAB — CULTURE, BODY FLUID W GRAM STAIN -BOTTLE

## 2017-03-09 LAB — CULTURE, BODY FLUID-BOTTLE: CULTURE: NO GROWTH

## 2017-03-10 LAB — MAGNESIUM: Magnesium: 2 mg/dL (ref 1.7–2.4)

## 2017-03-10 LAB — RENAL FUNCTION PANEL
ALBUMIN: 1.7 g/dL — AB (ref 3.5–5.0)
ANION GAP: 5 (ref 5–15)
BUN: 37 mg/dL — ABNORMAL HIGH (ref 6–20)
CALCIUM: 8.3 mg/dL — AB (ref 8.9–10.3)
CO2: 28 mmol/L (ref 22–32)
Chloride: 109 mmol/L (ref 101–111)
Creatinine, Ser: 1.02 mg/dL (ref 0.61–1.24)
GFR calc non Af Amer: >60 mL/min (ref >60)
Glucose, Bld: 78 mg/dL (ref 65–99)
PHOSPHORUS: 4.1 mg/dL (ref 2.5–4.6)
Potassium: 4.8 mmol/L (ref 3.5–5.1)
SODIUM: 142 mmol/L (ref 135–145)

## 2017-03-10 LAB — CBC WITH DIFFERENTIAL/PLATELET
BASOS ABS: 0.1 10*3/uL (ref 0.0–0.1)
BASOS PCT: 1 %
Eosinophils Absolute: 0.2 10*3/uL (ref 0.0–0.7)
Eosinophils Relative: 2 %
HEMATOCRIT: 28.7 % — AB (ref 39.0–52.0)
HEMOGLOBIN: 8.6 g/dL — AB (ref 13.0–17.0)
LYMPHS PCT: 15 %
Lymphs Abs: 1.4 10*3/uL (ref 0.7–4.0)
MCH: 27.2 pg (ref 26.0–34.0)
MCHC: 30 g/dL (ref 30.0–36.0)
MCV: 90.8 fL (ref 78.0–100.0)
MONOS PCT: 10 %
Monocytes Absolute: 1 10*3/uL (ref 0.1–1.0)
NEUTROS ABS: 6.8 10*3/uL (ref 1.7–7.7)
NEUTROS PCT: 72 %
Platelets: 513 10*3/uL — ABNORMAL HIGH (ref 150–400)
RBC: 3.16 MIL/uL — ABNORMAL LOW (ref 4.22–5.81)
RDW: 19.2 % — ABNORMAL HIGH (ref 11.5–15.5)
WBC: 9.4 10*3/uL (ref 4.0–10.5)

## 2017-03-10 LAB — CULTURE, BLOOD (ROUTINE X 2): SPECIAL REQUESTS: ADEQUATE

## 2017-03-12 LAB — VANCOMYCIN, TROUGH: Vancomycin Tr: 21 ug/mL (ref 15–20)

## 2017-03-13 LAB — CULTURE, BLOOD (ROUTINE X 2)
Culture: NO GROWTH
Special Requests: ADEQUATE

## 2017-03-15 LAB — VANCOMYCIN, TROUGH: Vancomycin Tr: 22 ug/mL (ref 15–20)

## 2017-03-16 LAB — VANCOMYCIN, TROUGH: Vancomycin Tr: 19 ug/mL (ref 15–20)

## 2017-03-17 LAB — BASIC METABOLIC PANEL
Anion gap: 3 — ABNORMAL LOW (ref 5–15)
BUN: 42 mg/dL — AB (ref 6–20)
CALCIUM: 8.3 mg/dL — AB (ref 8.9–10.3)
CO2: 28 mmol/L (ref 22–32)
CREATININE: 1.08 mg/dL (ref 0.61–1.24)
Chloride: 114 mmol/L — ABNORMAL HIGH (ref 101–111)
GFR calc Af Amer: >60 mL/min (ref >60)
GLUCOSE: 123 mg/dL — AB (ref 65–99)
Potassium: 4.4 mmol/L (ref 3.5–5.1)
SODIUM: 145 mmol/L (ref 135–145)

## 2017-03-17 LAB — MAGNESIUM: Magnesium: 2.1 mg/dL (ref 1.7–2.4)

## 2017-04-06 DEATH — deceased

## 2017-04-16 LAB — ACID FAST CULTURE WITH REFLEXED SENSITIVITIES (MYCOBACTERIA): Acid Fast Culture: NEGATIVE

## 2017-08-19 IMAGING — US US RENAL
1 series · 14 of 25 positions shown · non-contrast
Comparison: None.

CLINICAL DATA: Elevated creatinine level.

EXAM:
RENAL / URINARY TRACT ULTRASOUND COMPLETE

[Series 1: us renal · 0.23mm/px · 14 of 41 slices shown]
[im 1/41]
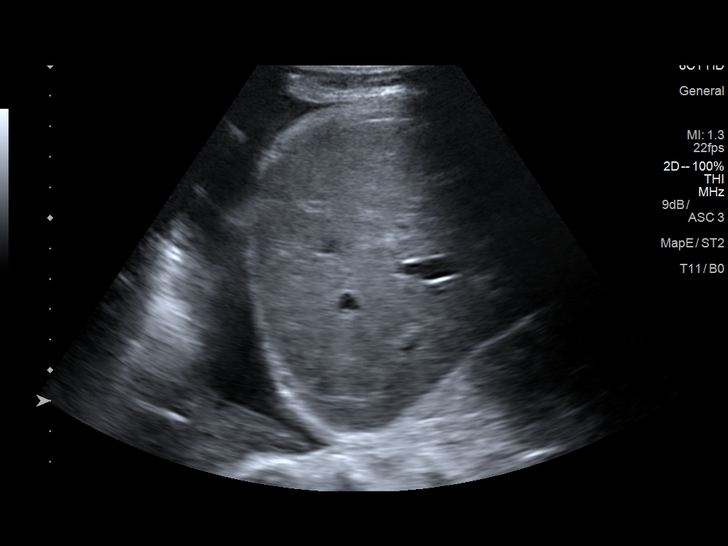
[im 4/41]
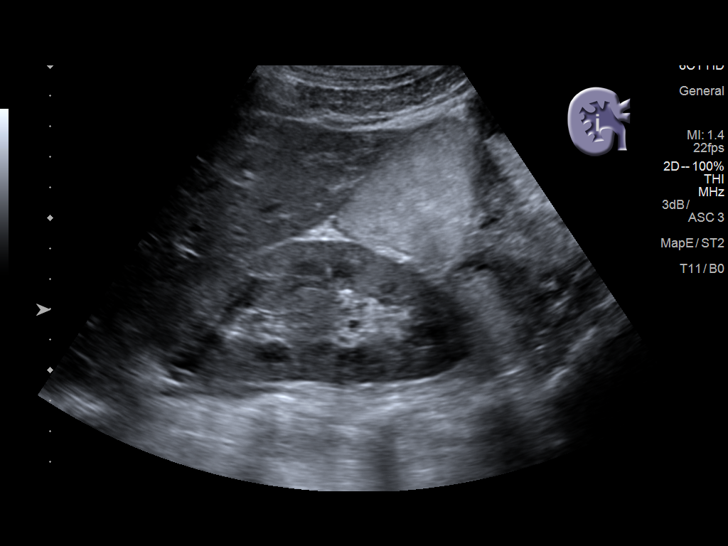
[im 7/41]
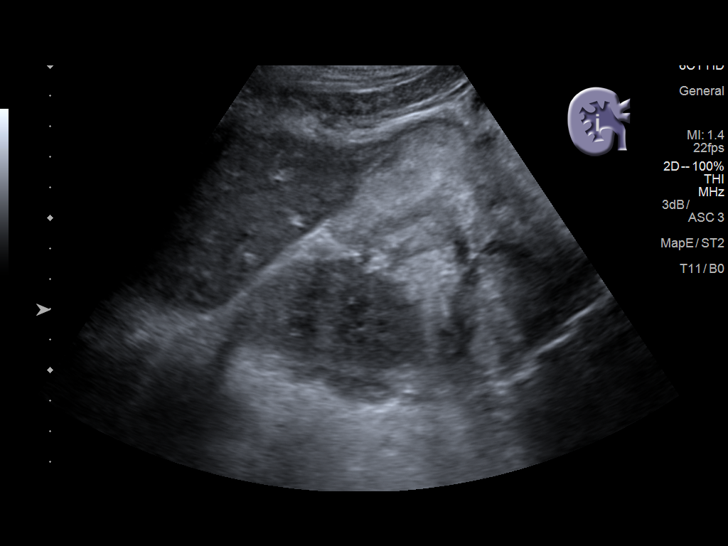
[im 11/41]
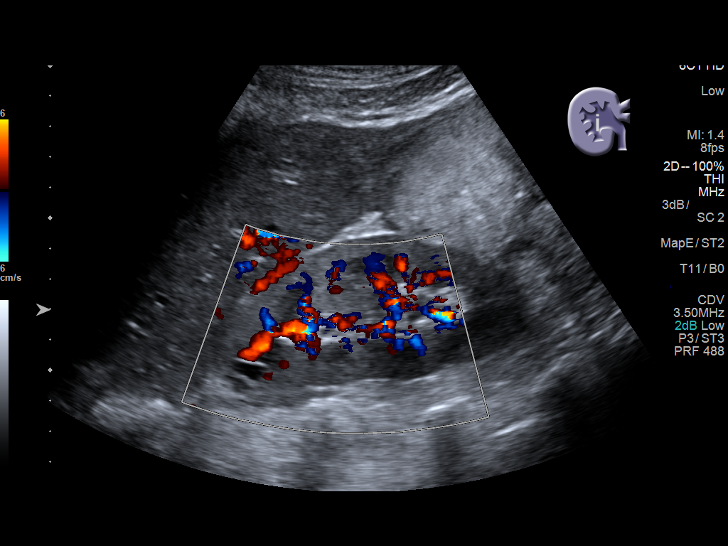
[im 14/41]
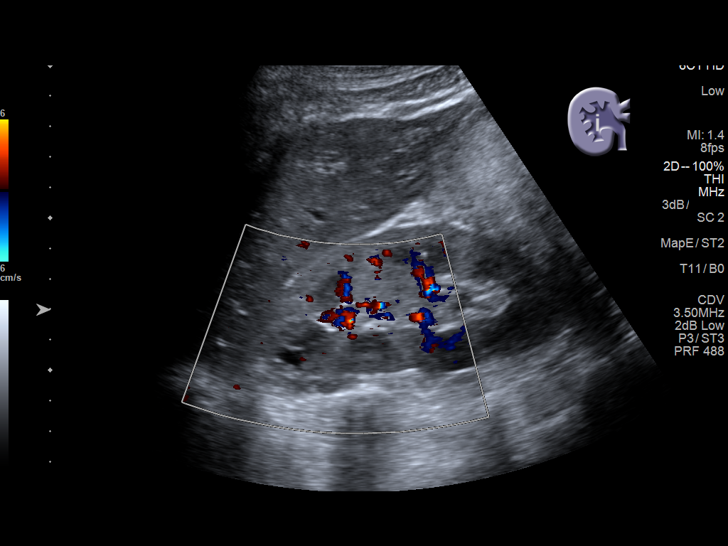
[im 16/41]
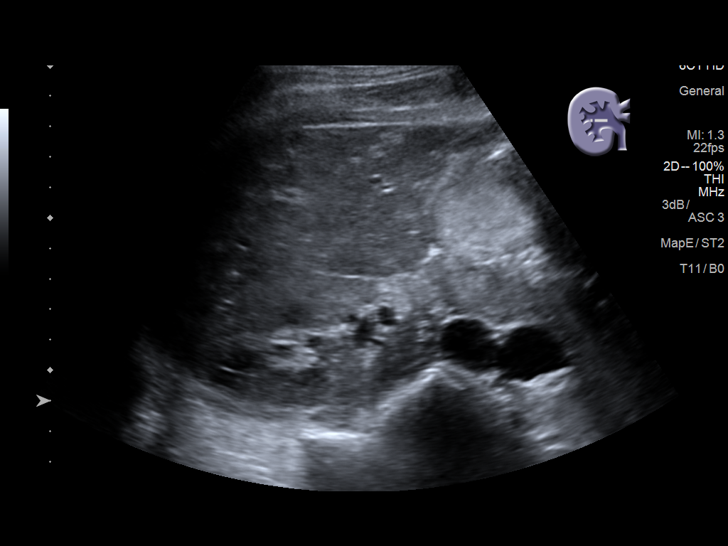
[im 19/41]
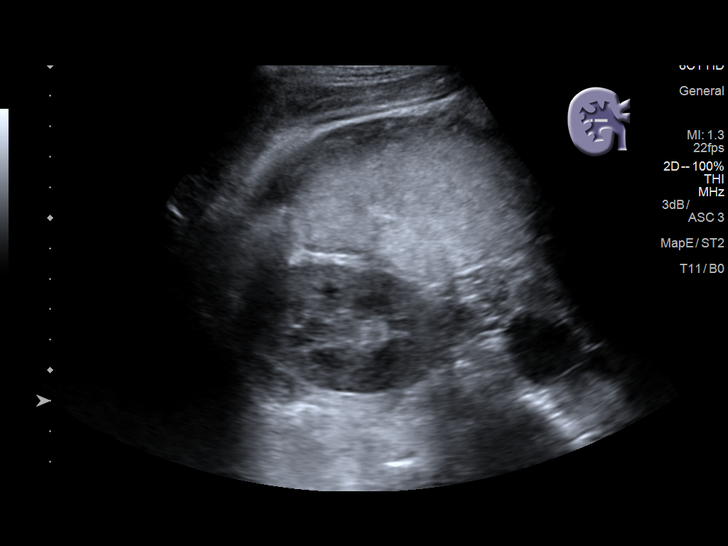
[im 22/41]
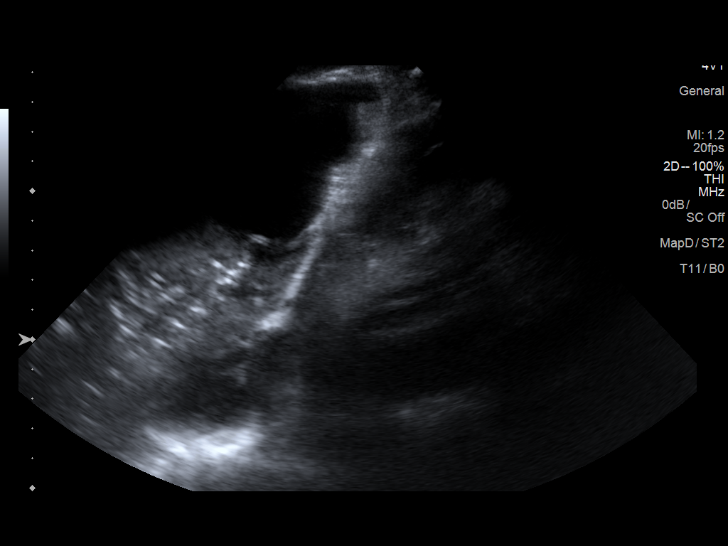
[im 26/41]
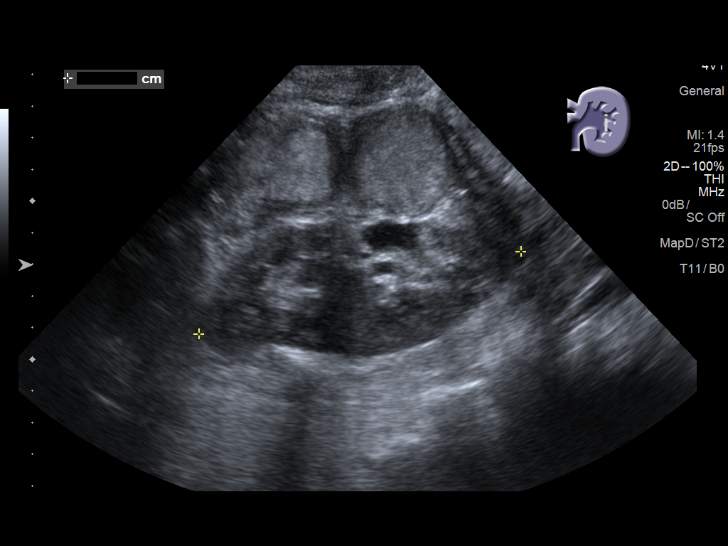
[im 27/41]
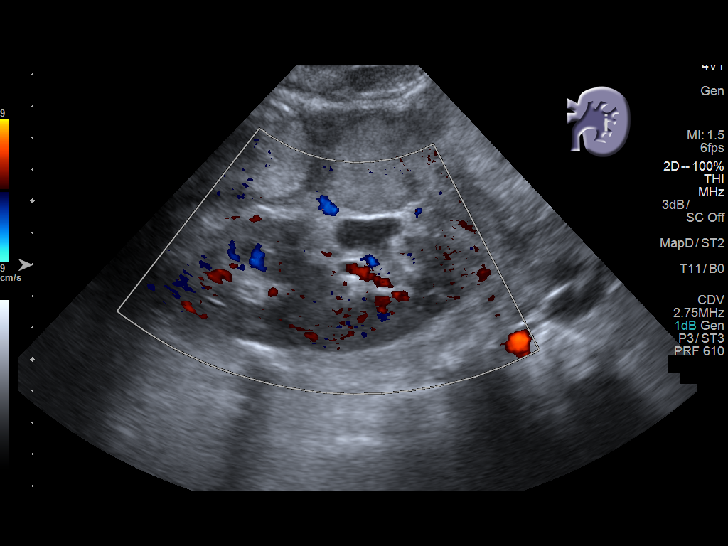
[im 31/41]
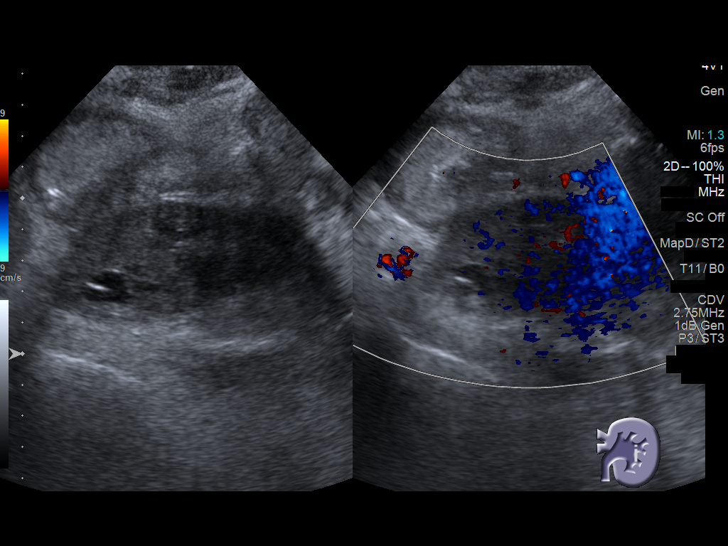
[im 34/41]
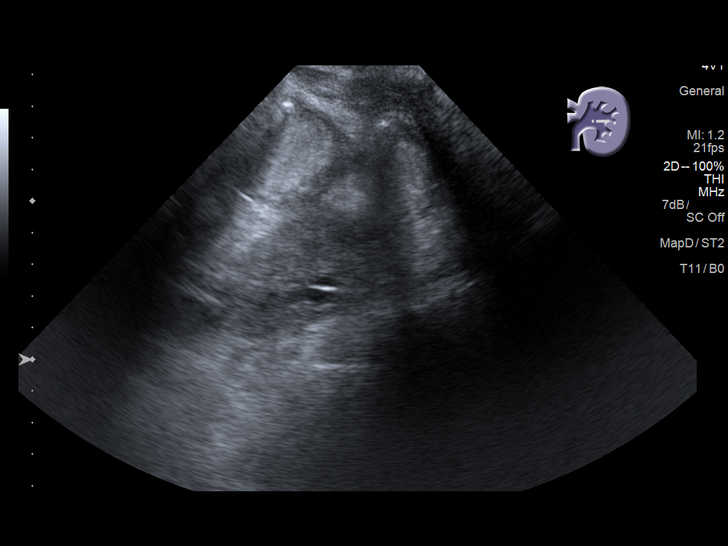
[im 37/41]
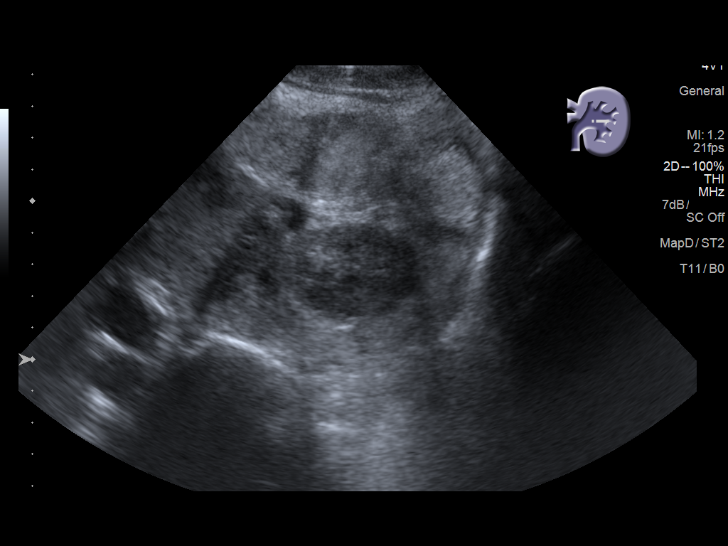
[im 41/41]
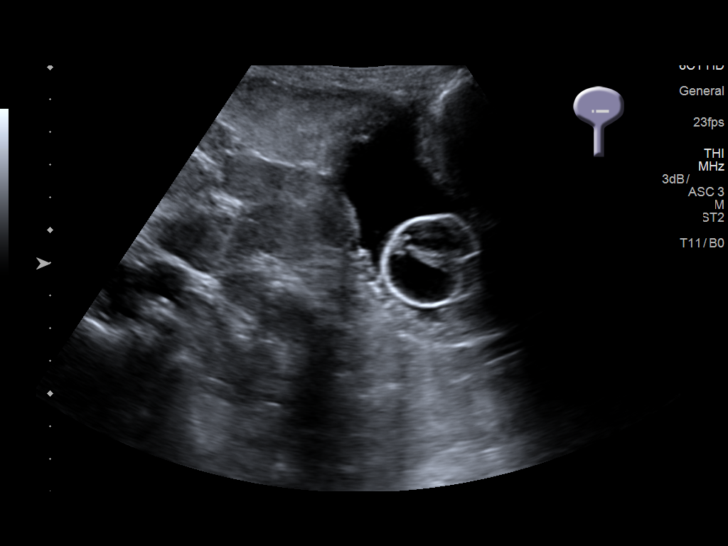

[14 of 25 positions shown; findings below may reference images not displayed]

FINDINGS: Right Kidney:

Length: 10.6 cm. Mildly increased echogenicity of renal parenchyma
is noted suggesting medical renal disease. 8 mm simple cyst is noted
in midpole. 1.7 cm septated cyst is noted in upper pole. No
hydronephrosis visualized.

Left Kidney:

Length: 10.5 cm. Mildly increased echogenicity of renal parenchyma
is noted suggesting medical renal disease. 1.5 cm septated cyst is
noted in upper pole. 3.5 cm simple cyst is noted in midpole. No
hydronephrosis visualized.

Bladder:

Foley catheter is noted.  Urinary bladder appears normal otherwise.
IMPRESSION: Increased echogenicity of renal parenchyma is noted bilaterally
suggesting medical renal disease. 8 mm septated cyst is seen in
right kidney, with 1.5 cm septated cyst seen in left kidney,
consistent with Bosniak type 2 lesions. Simple cysts are also noted
bilaterally.

## 2017-10-30 LAB — URINALYSIS, MICROSCOPIC (REFLEX): Bacteria, UA: NONE SEEN

## 2019-05-02 IMAGING — DX DG CHEST 1V PORT
1 series · 1 of 1 positions shown · non-contrast
Comparison: None.

CLINICAL DATA: Acute onset of respiratory failure. Initial
encounter.

EXAM:
PORTABLE CHEST 1 VIEW

[chest ap]
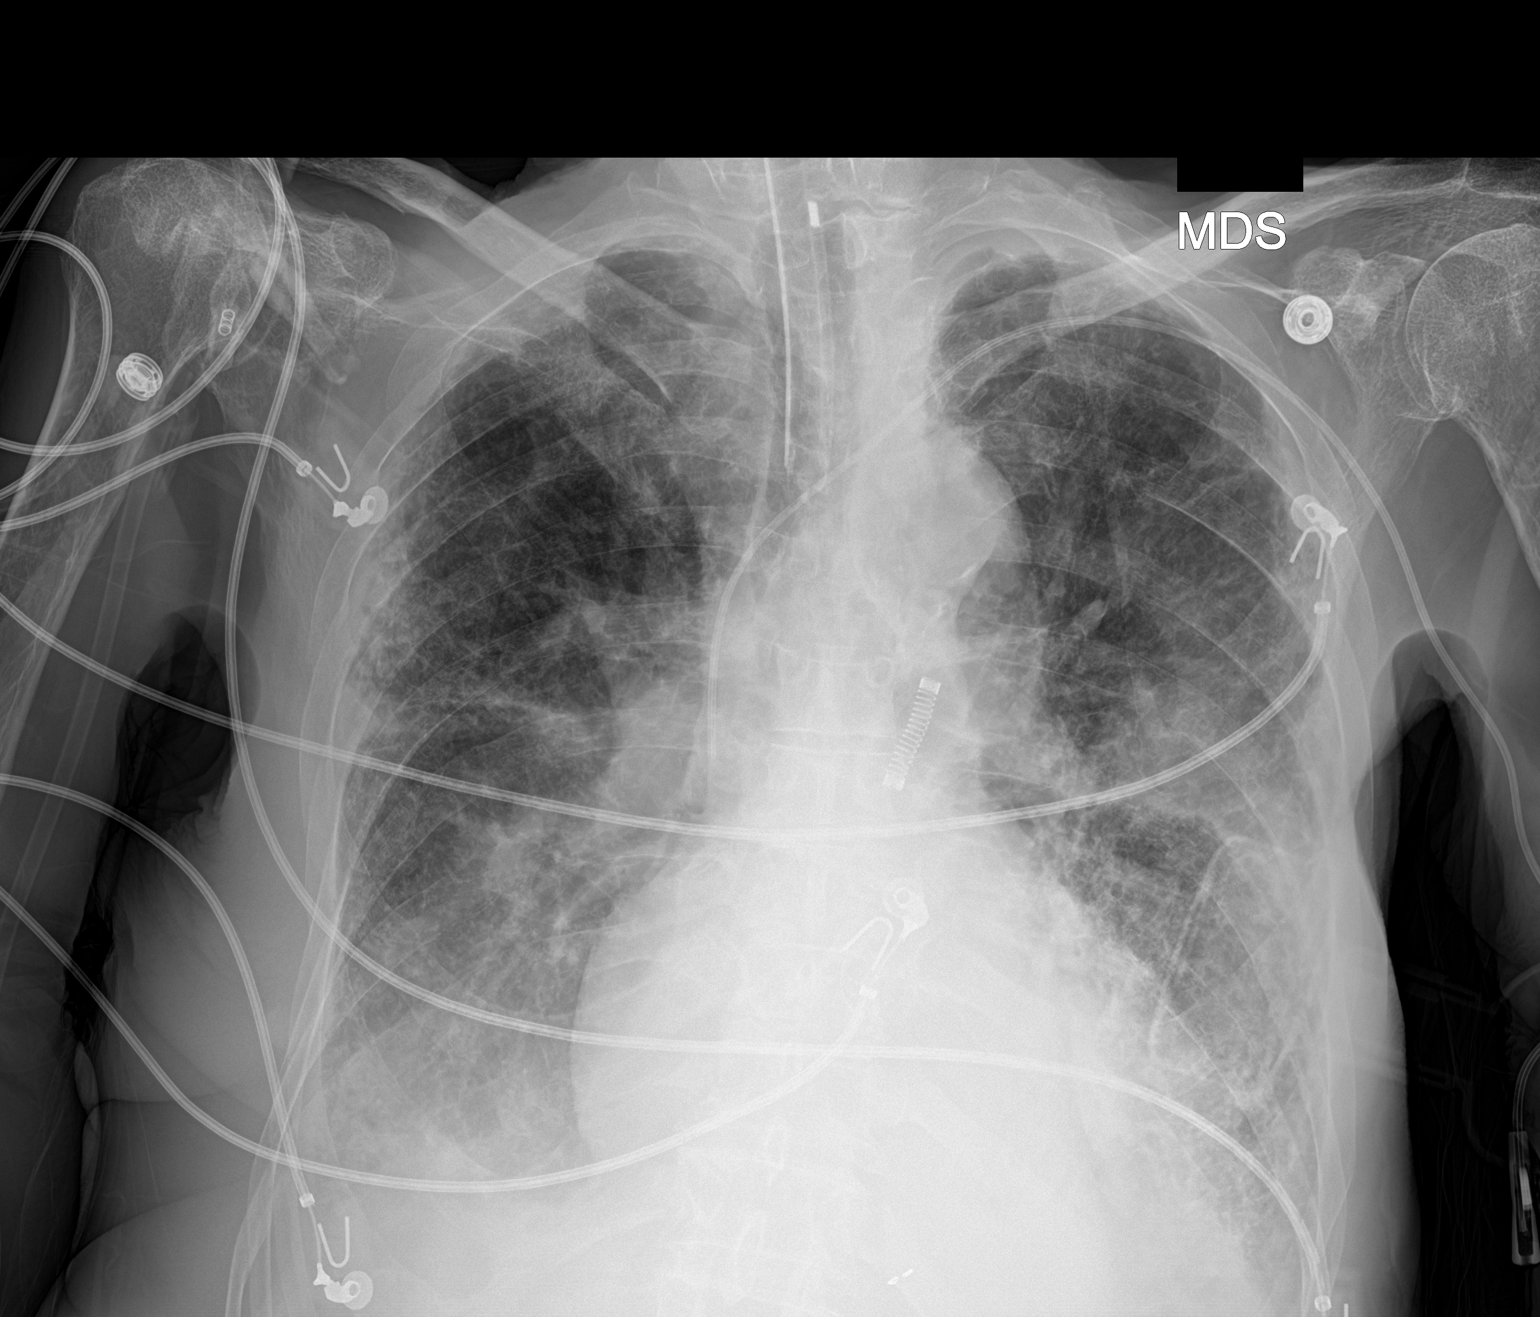

[1 of 1 positions shown; findings below may reference images not displayed]

FINDINGS: The patient's endotracheal tube is seen ending 3-4 cm above the
carina. A left PICC is noted ending about the mid SVC.

Patchy bilateral airspace opacification raises concern for
pneumonia. The appearance is less typical for pulmonary edema.
Underlying peripheral airspace opacity may reflect fibrotic change.
Small bilateral pleural effusions are suspected. No pneumothorax is
seen.

The cardiomediastinal silhouette is mildly enlarged. No acute
osseous abnormalities are identified.
IMPRESSION: 1. Endotracheal tube seen ending 3-4 cm above the carina.
2. Patchy bilateral airspace opacification raises concern for
pneumonia. The appearance is less typical for pulmonary edema,
though it remains a concern. Underlying peripheral airspace opacity
may reflect fibrosis. Suspect small bilateral pleural effusions.
3. Mild cardiomegaly.

## 2019-05-07 IMAGING — CR DG CHEST 1V PORT
1 series · 1 of 1 positions shown · non-contrast
Comparison: 12/30/2016

CLINICAL DATA: Respiratory distress.  Chest congestion.

EXAM:
PORTABLE CHEST 1 VIEW

[AP]
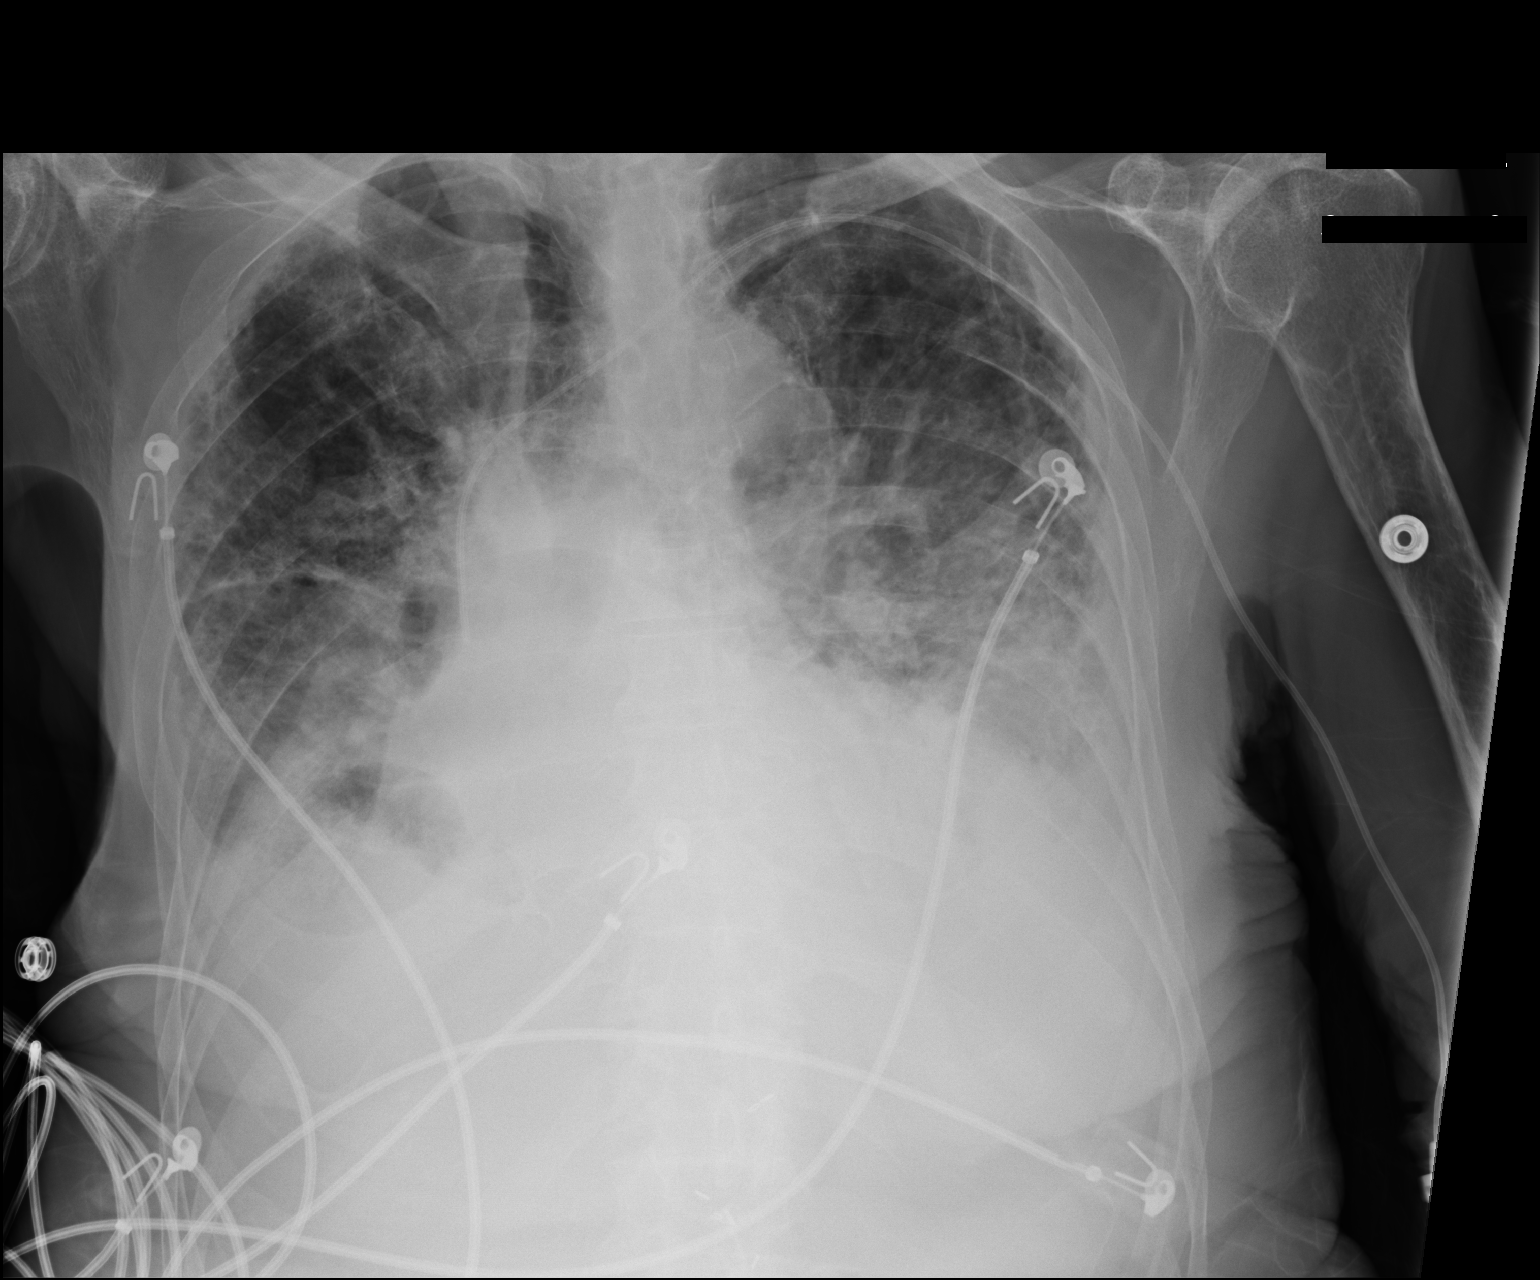

[1 of 1 positions shown; findings below may reference images not displayed]

FINDINGS: Endotracheal tube has been removed. PICC remains in good position.
There has been progression of the bilateral pulmonary infiltrates.
Increased density of the lung bases suggests superimposed small
effusions as well. There is new pulmonary vascular congestion.
IMPRESSION: Progressive bibasilar infiltrates.  Probable new small effusions.

## 2019-07-04 IMAGING — CR DG CHEST 1V PORT
1 series · 1 of 1 positions shown · non-contrast
Comparison: 02/19/2017.  02/17/2017.  CT 02/17/2017.

CLINICAL DATA: Tracheostomy tube.

EXAM:
PORTABLE CHEST 1 VIEW

[AP]
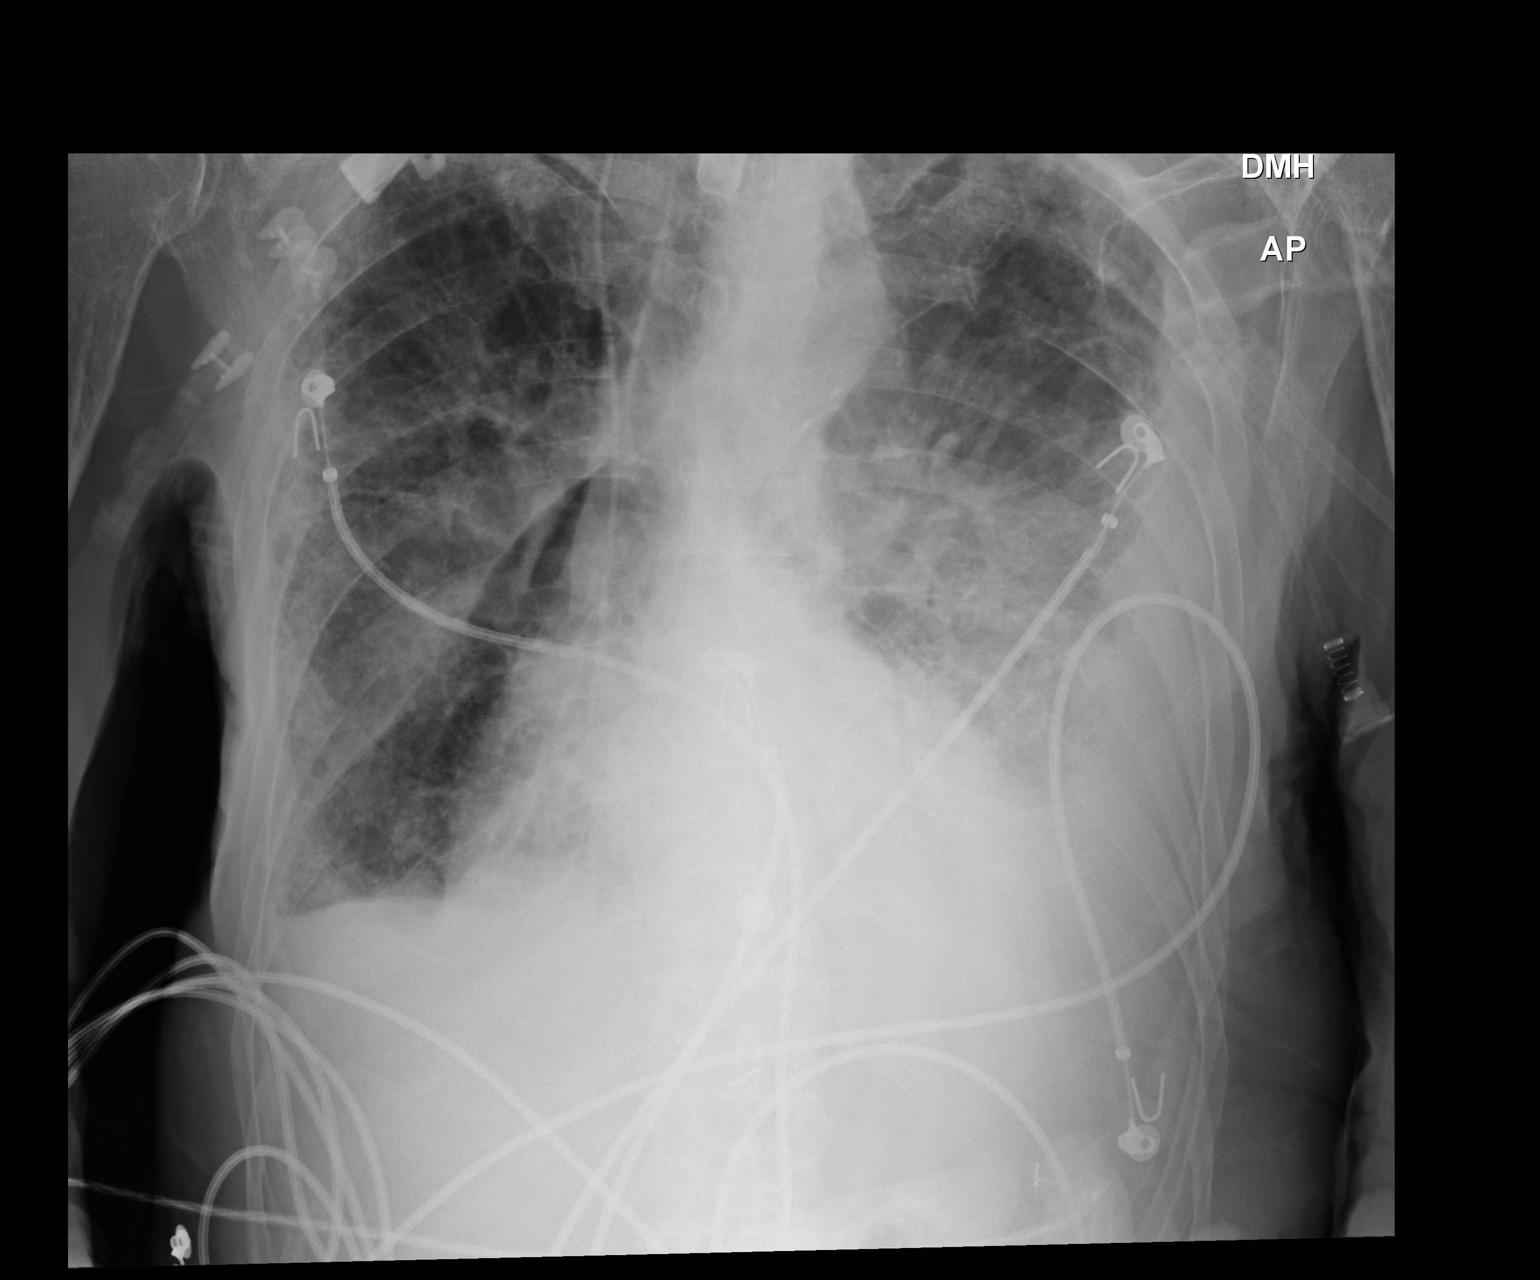

[1 of 1 positions shown; findings below may reference images not displayed]

FINDINGS: Tracheostomy tube and right IJ line stable position. Heart size
stable. Persistent bilateral pulmonary infiltrates, particularly
prominent on the left no change from prior CT. Interim increase in
bilateral pleural effusions, particular on the left. No
pneumothorax.
IMPRESSION: 1. Lines and tubes stable position.

2. Persistent bilateral pulmonary infiltrates, particularly
prominent on the left. No change from prior CT.

2. Progressive bilateral pleural effusions, particularly on the
left.
